# Patient Record
Sex: Male | Born: 1960
Health system: Southern US, Community
[De-identification: ages and names within clinical notes are randomized; demographics above are authoritative.]

## PROBLEM LIST (undated history)

## (undated) DIAGNOSIS — T7840XA Allergy, unspecified, initial encounter: Secondary | ICD-10-CM

## (undated) DIAGNOSIS — R011 Cardiac murmur, unspecified: Secondary | ICD-10-CM

## (undated) DIAGNOSIS — I10 Essential (primary) hypertension: Secondary | ICD-10-CM

## (undated) DIAGNOSIS — C3492 Malignant neoplasm of unspecified part of left bronchus or lung: Secondary | ICD-10-CM

## (undated) HISTORY — PX: KNEE SURGERY: SHX244

## (undated) HISTORY — DX: Essential (primary) hypertension: I10

## (undated) HISTORY — DX: Cardiac murmur, unspecified: R01.1

## (undated) HISTORY — DX: Allergy, unspecified, initial encounter: T78.40XA

## (undated) HISTORY — DX: Malignant neoplasm of unspecified part of left bronchus or lung: C34.92

## (undated) MED FILL — Iron Sucrose Inj 20 MG/ML (Fe Equiv): INTRAVENOUS | Qty: 10 | Status: AC

---

## 2003-06-22 ENCOUNTER — Ambulatory Visit (HOSPITAL_COMMUNITY): Admission: RE | Admit: 2003-06-22 | Discharge: 2003-06-22 | Payer: Self-pay | Admitting: Orthopedic Surgery

## 2003-06-22 ENCOUNTER — Encounter: Payer: Self-pay | Admitting: Orthopedic Surgery

## 2003-09-10 ENCOUNTER — Observation Stay (HOSPITAL_COMMUNITY): Admission: RE | Admit: 2003-09-10 | Discharge: 2003-09-11 | Payer: Self-pay | Admitting: Orthopedic Surgery

## 2006-12-06 ENCOUNTER — Emergency Department: Payer: Self-pay

## 2007-07-16 ENCOUNTER — Emergency Department: Payer: Self-pay | Admitting: Unknown Physician Specialty

## 2007-07-17 ENCOUNTER — Emergency Department: Payer: Self-pay | Admitting: Emergency Medicine

## 2010-02-01 ENCOUNTER — Emergency Department: Payer: Self-pay | Admitting: Emergency Medicine

## 2011-07-08 ENCOUNTER — Emergency Department: Payer: Self-pay | Admitting: Emergency Medicine

## 2013-09-01 ENCOUNTER — Ambulatory Visit: Payer: Self-pay | Admitting: Unknown Physician Specialty

## 2013-09-04 LAB — PATHOLOGY REPORT

## 2013-11-27 ENCOUNTER — Ambulatory Visit: Payer: Self-pay | Admitting: Hematology and Oncology

## 2013-11-28 LAB — CBC CANCER CENTER
BASOS ABS: 0 x10 3/mm (ref 0.0–0.1)
Basophil %: 0.5 %
EOS ABS: 0.1 x10 3/mm (ref 0.0–0.7)
EOS PCT: 1.4 %
HCT: 36.5 % — ABNORMAL LOW (ref 40.0–52.0)
HGB: 12.5 g/dL — AB (ref 13.0–18.0)
Lymphocyte #: 1.1 x10 3/mm (ref 1.0–3.6)
Lymphocyte %: 27.6 %
MCH: 30.7 pg (ref 26.0–34.0)
MCHC: 34.2 g/dL (ref 32.0–36.0)
MCV: 90 fL (ref 80–100)
Monocyte #: 0.3 x10 3/mm (ref 0.2–1.0)
Monocyte %: 6.9 %
NEUTROS PCT: 63.6 %
Neutrophil #: 2.4 x10 3/mm (ref 1.4–6.5)
Platelet: 354 x10 3/mm (ref 150–440)
RBC: 4.07 10*6/uL — AB (ref 4.40–5.90)
RDW: 12.7 % (ref 11.5–14.5)
WBC: 3.8 x10 3/mm (ref 3.8–10.6)

## 2013-12-21 ENCOUNTER — Ambulatory Visit: Payer: Self-pay | Admitting: Hematology and Oncology

## 2015-03-01 ENCOUNTER — Other Ambulatory Visit: Payer: Self-pay | Admitting: Hematology and Oncology

## 2015-03-01 ENCOUNTER — Encounter: Payer: Self-pay | Admitting: Hematology and Oncology

## 2015-03-01 DIAGNOSIS — C3492 Malignant neoplasm of unspecified part of left bronchus or lung: Secondary | ICD-10-CM

## 2015-05-10 ENCOUNTER — Telehealth: Payer: Self-pay | Admitting: *Deleted

## 2015-05-10 NOTE — Telephone Encounter (Signed)
Opened in error

## 2016-09-07 ENCOUNTER — Emergency Department
Admission: EM | Admit: 2016-09-07 | Discharge: 2016-09-07 | Disposition: A | Discharge status: Home / Self Care | Payer: 59 | Attending: Emergency Medicine | Admitting: Emergency Medicine

## 2016-09-07 ENCOUNTER — Emergency Department: Payer: 59

## 2016-09-07 ENCOUNTER — Encounter: Payer: Self-pay | Admitting: Emergency Medicine

## 2016-09-07 DIAGNOSIS — N201 Calculus of ureter: Secondary | ICD-10-CM | POA: Insufficient documentation

## 2016-09-07 DIAGNOSIS — N200 Calculus of kidney: Secondary | ICD-10-CM

## 2016-09-07 DIAGNOSIS — Z85118 Personal history of other malignant neoplasm of bronchus and lung: Secondary | ICD-10-CM | POA: Diagnosis not present

## 2016-09-07 DIAGNOSIS — R109 Unspecified abdominal pain: Secondary | ICD-10-CM | POA: Diagnosis present

## 2016-09-07 LAB — URINALYSIS COMPLETE WITH MICROSCOPIC (ARMC ONLY)
BILIRUBIN URINE: NEGATIVE
Bacteria, UA: NONE SEEN
Glucose, UA: NEGATIVE mg/dL
KETONES UR: NEGATIVE mg/dL
Leukocytes, UA: NEGATIVE
NITRITE: NEGATIVE
PROTEIN: 30 mg/dL — AB
SPECIFIC GRAVITY, URINE: 1.019 (ref 1.005–1.030)
Squamous Epithelial / LPF: NONE SEEN
pH: 8 (ref 5.0–8.0)

## 2016-09-07 LAB — CBC WITH DIFFERENTIAL/PLATELET
Basophils Absolute: 0 10*3/uL (ref 0–0.1)
Basophils Relative: 0 %
EOS ABS: 0 10*3/uL (ref 0–0.7)
EOS PCT: 0 %
HCT: 36.9 % — ABNORMAL LOW (ref 40.0–52.0)
Hemoglobin: 11.9 g/dL — ABNORMAL LOW (ref 13.0–18.0)
LYMPHS ABS: 0.7 10*3/uL — AB (ref 1.0–3.6)
Lymphocytes Relative: 8 %
MCH: 28.4 pg (ref 26.0–34.0)
MCHC: 32.3 g/dL (ref 32.0–36.0)
MCV: 87.9 fL (ref 80.0–100.0)
MONOS PCT: 3 %
Monocytes Absolute: 0.3 10*3/uL (ref 0.2–1.0)
Neutro Abs: 8.5 10*3/uL — ABNORMAL HIGH (ref 1.4–6.5)
Neutrophils Relative %: 89 %
PLATELETS: 419 10*3/uL (ref 150–440)
RBC: 4.19 MIL/uL — ABNORMAL LOW (ref 4.40–5.90)
RDW: 13.5 % (ref 11.5–14.5)
WBC: 9.5 10*3/uL (ref 3.8–10.6)

## 2016-09-07 LAB — BASIC METABOLIC PANEL
Anion gap: 9 (ref 5–15)
BUN: 13 mg/dL (ref 6–20)
CO2: 25 mmol/L (ref 22–32)
CREATININE: 1.32 mg/dL — AB (ref 0.61–1.24)
Calcium: 9.5 mg/dL (ref 8.9–10.3)
Chloride: 106 mmol/L (ref 101–111)
GFR calc Af Amer: >60 mL/min (ref >60)
GFR, EST NON AFRICAN AMERICAN: 60 mL/min — AB (ref >60)
Glucose, Bld: 117 mg/dL — ABNORMAL HIGH (ref 65–99)
Potassium: 3.6 mmol/L (ref 3.5–5.1)
SODIUM: 140 mmol/L (ref 135–145)

## 2016-09-07 MED ORDER — HYDROMORPHONE HCL 1 MG/ML IJ SOLN
INTRAMUSCULAR | Status: AC
  Filled 2016-09-07: qty 1

## 2016-09-07 MED ORDER — TAMSULOSIN HCL 0.4 MG PO CAPS
0.4000 mg | ORAL_CAPSULE | Freq: Once | ORAL | Status: AC
  Administered 2016-09-07: 0.4 mg via ORAL
  Filled 2016-09-07 (×2): qty 1

## 2016-09-07 MED ORDER — ACETAMINOPHEN 500 MG PO TABS
1000.0000 mg | ORAL_TABLET | Freq: Once | ORAL | Status: AC
  Administered 2016-09-07: 1000 mg via ORAL
  Filled 2016-09-07: qty 2

## 2016-09-07 MED ORDER — ONDANSETRON 4 MG PO TBDP: 4.0000 mg | ORAL_TABLET | Freq: Three times a day (TID) | ORAL | 0 refills | Status: DC | PRN

## 2016-09-07 MED ORDER — OXYCODONE HCL 5 MG PO TABS
5.0000 mg | ORAL_TABLET | Freq: Once | ORAL | Status: AC
  Administered 2016-09-07: 5 mg via ORAL
  Filled 2016-09-07: qty 1

## 2016-09-07 MED ORDER — SODIUM CHLORIDE 0.9 % IV SOLN
Freq: Once | INTRAVENOUS | Status: AC
  Administered 2016-09-07: 15:00:00 via INTRAVENOUS

## 2016-09-07 MED ORDER — ONDANSETRON HCL 4 MG/2ML IJ SOLN
INTRAMUSCULAR | Status: AC
  Administered 2016-09-07: 4 mg
  Filled 2016-09-07: qty 2

## 2016-09-07 MED ORDER — KETOROLAC TROMETHAMINE 30 MG/ML IJ SOLN
30.0000 mg | Freq: Once | INTRAMUSCULAR | Status: AC
  Administered 2016-09-07: 30 mg via INTRAVENOUS
  Filled 2016-09-07: qty 1

## 2016-09-07 MED ORDER — OXYCODONE-ACETAMINOPHEN 5-325 MG PO TABS: 1.0000 | ORAL_TABLET | ORAL | 0 refills | Status: DC | PRN

## 2016-09-07 MED ORDER — HYDROMORPHONE HCL 1 MG/ML IJ SOLN
1.0000 mg | Freq: Once | INTRAMUSCULAR | Status: AC
  Administered 2016-09-07: 1 mg via INTRAVENOUS

## 2016-09-07 MED ORDER — MORPHINE SULFATE (PF) 4 MG/ML IV SOLN
INTRAVENOUS | Status: AC
  Administered 2016-09-07: 4 mg
  Filled 2016-09-07: qty 1

## 2016-09-07 MED ORDER — IBUPROFEN 800 MG PO TABS: 800.0000 mg | ORAL_TABLET | Freq: Three times a day (TID) | ORAL | 0 refills | Status: DC | PRN

## 2016-09-07 MED ORDER — TAMSULOSIN HCL 0.4 MG PO CAPS: 0.4000 mg | ORAL_CAPSULE | Freq: Every day | ORAL | 0 refills | Status: AC

## 2016-09-07 NOTE — Discharge Instructions (Signed)
You have been seen in the Emergency Department (ED)  Today and was diagnosed with kidney stones. While the stone is traveling through the ureter, which is the tube that carries urine from the kidney to the bladder, you will probably feel pain. The pain may be mild or very severe. You may also have some blood in your urine. As soon as the stone reaches the bladder, any intense pain should go away. If a stone is too large to pass on its own, you may need a medical procedure to help you pass the stone.   As we have discussed, please drink plenty of fluids and use a urinary strainer to attempt to capture the stone.  Please make a follow up appointment with Urology in the next week by calling the number below and bring the stone with you.  Take ibuprofen '800mg'$  every 6 hours for the pain. If the pain is not well controlled with ibuprofen you may take one percocet every 4 hours. Do not take tylenol while taking percocet. Please also take your prescribed flomax daily. Check with your doctor if you have a history of gastritis, stomach ulcers, renal failure or impaired kidney function as you may not be able to take ibuprofen/ motrin. Your doctor can give you a different prescription for pain control.  Follow-up with your doctor or return to the ER in 12-24 hours if your pain is not well controlled, if you develop pain or burning with urination, or if you develop a fever. Otherwise follow up in 3-5 days with your doctor.  When should you call for help?  Call your doctor now or seek immediate medical care if:  You cannot keep down fluids.  Your pain gets worse.  You have a fever or chills.  You have new or worse pain in your back just below your rib cage (the flank area).  You have new or more blood in your urine. You have pain or burning with urination You are unable to urinate You have abdominal pain  Watch closely for changes in your health, and be sure to contact your doctor if:  You do not get better as  expected  How can you care for yourself at home?  Drink plenty of fluids, enough so that your urine is light yellow or clear like water. If you have kidney, heart, or liver disease and have to limit fluids, talk with your doctor before you increase the amount of fluids you drink.  Take pain medicines exactly as directed. Call your doctor if you think you are having a problem with your medicine.  If the doctor gave you a prescription medicine for pain, take it as prescribed.  If you are not taking a prescription pain medicine, ask your doctor if you can take an over-the-counter medicine. Read and follow all instructions on the label. Your doctor may ask you to strain your urine so that you can collect your kidney stone when it passes. You can use a kitchen strainer or a tea strainer to catch the stone. Store it in a plastic bag until you see your doctor again.  Preventing future kidney stones  Some changes in your diet may help prevent kidney stones. Depending on the cause of your stones, your doctor may recommend that you:  Drink plenty of fluids, enough so that your urine is light yellow or clear like water. If you have kidney, heart, or liver disease and have to limit fluids, talk with your doctor before you increase  the amount of fluids you drink.  Limit coffee, tea, and alcohol. Also avoid grapefruit juice.  Do not take more than the recommended daily dose of vitamins C and D.  Avoid antacids such as Gaviscon, Maalox, Mylanta, or Tums.  Limit the amount of salt (sodium) in your diet.  Eat a balanced diet that is not too high in protein.  Limit foods that are high in a substance called oxalate, which can cause kidney stones. These foods include dark green vegetables, rhubarb, chocolate, wheat bran, nuts, cranberries, and beans.

## 2016-09-07 NOTE — ED Triage Notes (Signed)
Pt with lower left flank pain started about an hour ago.

## 2016-09-07 NOTE — ED Provider Notes (Signed)
[facility] Emergency Department Provider Note  ____________________________________________  Time seen: Approximately 3:00 PM  I have reviewed the triage vital signs and the nursing notes.   HISTORY  Chief Complaint Flank Pain   HPI Randy Sawyer is a 55 y.o. male no significant past medical history who presents for evaluation of left flank pain. Patient reports sudden onset of left-sided flank pain radiating to the left lower quadrant that started an hour and a half ago. The pain is severe, sharp, constant, associated with a dry heaving and nausea. Patient denies ever having similar pain, or history of kidney stones. Patient denies having history of lung cancer as it is documented in his past medical history. He denies chest pain or shortness of breath, constipation, diarrhea, fever, chills, dysuria, hematuria.  History reviewed. No pertinent past medical history.  Patient Active Problem List   Diagnosis Date Noted  . Cancer of left lung [facility])     History reviewed. No pertinent surgical history.  Prior to Admission medications   Medication Sig Start Date End Date Taking? Authorizing Provider  ibuprofen (ADVIL,MOTRIN) 800 MG tablet Take 1 tablet (800 mg total) by mouth every 8 (eight) hours as needed. 09/07/16   Rudene Re, MD  ondansetron (ZOFRAN ODT) 4 MG disintegrating tablet Take 1 tablet (4 mg total) by mouth every 8 (eight) hours as needed for nausea or vomiting. 09/07/16   Rudene Re, MD  oxyCODONE-acetaminophen (ROXICET) 5-325 MG tablet Take 1 tablet by mouth every 4 (four) hours as needed for severe pain. 09/07/16   Rudene Re, MD  tamsulosin (FLOMAX) 0.4 MG CAPS capsule Take 1 capsule (0.4 mg total) by mouth daily. 09/07/16 09/14/16  Rudene Re, MD    Allergies Patient has no known allergies.  No family history on file.  Social History Social History  Substance Use Topics  . Smoking status: Never Smoker  .  Smokeless tobacco: Never Used  . Alcohol use Not on file    Review of Systems  Constitutional: Negative for fever. Eyes: Negative for visual changes. ENT: Negative for sore throat. Cardiovascular: Negative for chest pain. Respiratory: Negative for shortness of breath. Gastrointestinal: Negative for abdominal pain, vomiting or diarrhea. Genitourinary: Negative for dysuria. + L flank pain Musculoskeletal: Negative for back pain. Skin: Negative for rash. Neurological: Negative for headaches, weakness or numbness.  ____________________________________________   PHYSICAL EXAM:  VITAL SIGNS: ED Triage Vitals  Enc Vitals Group     BP 09/07/16 1413 123/73     Pulse Rate 09/07/16 1413 69     Resp 09/07/16 1413 20     Temp 09/07/16 1413 97.5 F (36.4 C)     Temp Source 09/07/16 1413 Oral     SpO2 09/07/16 1413 100 %     Weight 09/07/16 1415 213 lb (96.6 kg)     Height 09/07/16 1415 '5\' 9"'$  (1.753 m)     Head Circumference --      Peak Flow --      Pain Score 09/07/16 1415 10     Pain Loc --      Pain Edu? --      Excl. in [place]? --     Constitutional: Alert and oriented. Well appearing and in no apparent distress. HEENT:      Head: Normocephalic and atraumatic.         Eyes: Conjunctivae are normal. Sclera is non-icteric. EOMI. PERRL      Mouth/Throat: Mucous membranes are moist.  Neck: Supple with no signs of meningismus. Cardiovascular: Regular rate and rhythm. No murmurs, gallops, or rubs. 2+ symmetrical distal pulses are present in all extremities. No JVD. Respiratory: Normal respiratory effort. Lungs are clear to auscultation bilaterally. No wheezes, crackles, or rhonchi.  Gastrointestinal: Soft, non tender, and non distended with positive bowel sounds. No rebound or guarding. Genitourinary: No CVA tenderness. Bilateral descended testes with positive cremasteric reflex and no tenderness to palpation, no swelling or erythema of the scrotum, no evidence of inguinal hernia  bilaterally. Musculoskeletal: Nontender with normal range of motion in all extremities. No edema, cyanosis, or erythema of extremities. Neurologic: Normal speech and language. Face is symmetric. Moving all extremities. No gross focal neurologic deficits are appreciated. Skin: Skin is warm, dry and intact. No rash noted. Psychiatric: Mood and affect are normal. Speech and behavior are normal.  ____________________________________________   LABS (all labs ordered are listed, but only abnormal results are displayed)  Labs Reviewed  URINALYSIS COMPLETEWITH MICROSCOPIC ([facility] ONLY) - Abnormal; Notable for the following:       Result Value   Color, Urine YELLOW (*)    APPearance CLOUDY (*)    Hgb urine dipstick 1+ (*)    Protein, ur 30 (*)    All other components within normal limits  CBC WITH DIFFERENTIAL/PLATELET - Abnormal; Notable for the following:    RBC 4.19 (*)    Hemoglobin 11.9 (*)    HCT 36.9 (*)    Neutro Abs 8.5 (*)    Lymphs Abs 0.7 (*)    All other components within normal limits  BASIC METABOLIC PANEL - Abnormal; Notable for the following:    Glucose, Bld 117 (*)    Creatinine, Ser 1.32 (*)    GFR calc non Af Amer 60 (*)    All other components within normal limits  URINE CULTURE   ____________________________________________  EKG  none ____________________________________________  RADIOLOGY  CT renal: Obstructing 3 x 4 mm stone in the left mid ureter with mild hydroureteronephrosis. ____________________________________________   PROCEDURES  Procedure(s) performed: None Procedures Critical Care performed:  None ____________________________________________   INITIAL IMPRESSION / ASSESSMENT AND PLAN / ED COURSE  55 y.o. male no significant past medical history who presents for evaluation of sudden onset of left flank pain radiating to the groin. Abdomen is soft and nontender, no flank tenderness, GU exams within normal limits. Vital signs are within  normal limits. Urinalysis 1+ hemoglobin in 6-30 rbc's concerning for possible kidney stone. Patient was given IV morphine, IV fluids, IV Zofran. We'll check basic labs and get a CT renal.  Clinical Course as of Sep 07 1846  Thu Sep 07, 2016  1845 Patient's pain is well controlled. Tolerating by mouth. CT scan showing a 4 mm stone. UA with no evidence of urinary tract infection. Normal white count, stable hemoglobin and creatinine. Patient be discharged home on Flomax, ibuprofen, Percocet, Zofran, and follow up with urology.  [CV]    Clinical Course User Index [CV] Rudene Re, MD    Pertinent labs & imaging results that were available during my care of the patient were reviewed by me and considered in my medical decision making (see chart for details).    ____________________________________________   FINAL CLINICAL IMPRESSION(S) / ED DIAGNOSES  Final diagnoses:  Kidney stone      NEW MEDICATIONS STARTED DURING THIS VISIT:  New Prescriptions   IBUPROFEN (ADVIL,MOTRIN) 800 MG TABLET    Take 1 tablet (800 mg total) by mouth every 8 (eight)  hours as needed.   ONDANSETRON (ZOFRAN ODT) 4 MG DISINTEGRATING TABLET    Take 1 tablet (4 mg total) by mouth every 8 (eight) hours as needed for nausea or vomiting.   OXYCODONE-ACETAMINOPHEN (ROXICET) 5-325 MG TABLET    Take 1 tablet by mouth every 4 (four) hours as needed for severe pain.   TAMSULOSIN (FLOMAX) 0.4 MG CAPS CAPSULE    Take 1 capsule (0.4 mg total) by mouth daily.     Note:  This document was prepared using Dragon voice recognition software and may include unintentional dictation errors.    Rudene Re, MD 09/07/16 [phone]

## 2016-09-09 LAB — URINE CULTURE: CULTURE: NO GROWTH

## 2018-02-05 DIAGNOSIS — J301 Allergic rhinitis due to pollen: Secondary | ICD-10-CM | POA: Diagnosis not present

## 2018-02-05 DIAGNOSIS — Z0000 Encounter for general adult medical examination without abnormal findings: Secondary | ICD-10-CM | POA: Diagnosis not present

## 2018-02-07 DIAGNOSIS — D649 Anemia, unspecified: Secondary | ICD-10-CM | POA: Diagnosis not present

## 2018-02-07 DIAGNOSIS — Z1159 Encounter for screening for other viral diseases: Secondary | ICD-10-CM | POA: Diagnosis not present

## 2018-02-07 DIAGNOSIS — Z0000 Encounter for general adult medical examination without abnormal findings: Secondary | ICD-10-CM | POA: Diagnosis not present

## 2018-02-07 DIAGNOSIS — E669 Obesity, unspecified: Secondary | ICD-10-CM | POA: Diagnosis not present

## 2018-07-22 ENCOUNTER — Emergency Department (HOSPITAL_COMMUNITY): Payer: 59

## 2018-07-22 ENCOUNTER — Encounter (HOSPITAL_COMMUNITY): Payer: Self-pay | Admitting: Emergency Medicine

## 2018-07-22 ENCOUNTER — Other Ambulatory Visit: Payer: Self-pay

## 2018-07-22 ENCOUNTER — Emergency Department (HOSPITAL_COMMUNITY)
Admission: EM | Admit: 2018-07-22 | Discharge: 2018-07-22 | Disposition: A | Discharge status: Home / Self Care | Payer: 59 | Attending: Emergency Medicine | Admitting: Emergency Medicine

## 2018-07-22 DIAGNOSIS — Y998 Other external cause status: Secondary | ICD-10-CM | POA: Diagnosis not present

## 2018-07-22 DIAGNOSIS — Y929 Unspecified place or not applicable: Secondary | ICD-10-CM | POA: Insufficient documentation

## 2018-07-22 DIAGNOSIS — Y939 Activity, unspecified: Secondary | ICD-10-CM | POA: Insufficient documentation

## 2018-07-22 DIAGNOSIS — S92515A Nondisplaced fracture of proximal phalanx of left lesser toe(s), initial encounter for closed fracture: Secondary | ICD-10-CM | POA: Diagnosis not present

## 2018-07-22 DIAGNOSIS — S99102A Unspecified physeal fracture of left metatarsal, initial encounter for closed fracture: Secondary | ICD-10-CM

## 2018-07-22 DIAGNOSIS — S99922A Unspecified injury of left foot, initial encounter: Secondary | ICD-10-CM | POA: Diagnosis not present

## 2018-07-22 DIAGNOSIS — S92302A Fracture of unspecified metatarsal bone(s), left foot, initial encounter for closed fracture: Secondary | ICD-10-CM | POA: Insufficient documentation

## 2018-07-22 DIAGNOSIS — W228XXA Striking against or struck by other objects, initial encounter: Secondary | ICD-10-CM | POA: Diagnosis not present

## 2018-07-22 MED ORDER — IBUPROFEN 800 MG PO TABS: 800.0000 mg | ORAL_TABLET | Freq: Three times a day (TID) | ORAL | 0 refills | Status: DC | PRN

## 2018-07-22 NOTE — ED Provider Notes (Signed)
[facility] EMERGENCY DEPARTMENT Provider Note   CSN: [id number] Arrival date & time: 07/22/18  1321     History   Chief Complaint Chief Complaint  Patient presents with  . Toe Pain    HPI Randy Sawyer is a 57 y.o. male.  The history is provided by the patient. No language interpreter was used.  Toe Pain      57 year old male presenting for evaluation of toe injury.  Patient report last night he accidentally stubbed his left little toe against the leg of a piece of furniture.  Report acute onset of sharp pain to his toe with swelling.  States swelling did improved after I but he would like to check to make sure it is not broken.  Pain is mild at this time pain is nonradiating, no other injury.  Denies any numbness.  History reviewed. No pertinent past medical history.  Patient Active Problem List   Diagnosis Date Noted  . Cancer of left lung [facility])     History reviewed. No pertinent surgical history.      Home Medications    Prior to Admission medications   Medication Sig Start Date End Date Taking? Authorizing Provider  ibuprofen (ADVIL,MOTRIN) 800 MG tablet Take 1 tablet (800 mg total) by mouth every 8 (eight) hours as needed. 09/07/16   Rudene Re, MD  ondansetron (ZOFRAN ODT) 4 MG disintegrating tablet Take 1 tablet (4 mg total) by mouth every 8 (eight) hours as needed for nausea or vomiting. 09/07/16   Rudene Re, MD  oxyCODONE-acetaminophen (ROXICET) 5-325 MG tablet Take 1 tablet by mouth every 4 (four) hours as needed for severe pain. 09/07/16   Rudene Re, MD    Family History History reviewed. No pertinent family history.  Social History Social History   Tobacco Use  . Smoking status: Never Smoker  . Smokeless tobacco: Never Used  Substance Use Topics  . Alcohol use: Not on file  . Drug use: Not on file     Allergies   Patient has no known allergies.   Review of Systems Review of Systems    Constitutional: Negative for fever.  Musculoskeletal: Positive for joint swelling.  Neurological: Negative for numbness.     Physical Exam Updated Vital Signs BP (!) 155/79 (BP Location: Right Arm)   Pulse 65   Temp 98.5 F (36.9 C) (Oral)   Resp 16   SpO2 99%   Physical Exam  Constitutional: He appears well-developed and well-nourished. No distress.  HENT:  Head: Atraumatic.  Eyes: Conjunctivae are normal.  Neck: Neck supple.  Musculoskeletal: He exhibits tenderness (Left foot: Tenderness to fourth and fifth metatarsal region on palpation with mild swelling noted.  No obvious deformity, dorsalis pedis pulse palpable with brisk cap refill).  Neurological: He is alert.  Skin: No rash noted.  Psychiatric: He has a normal mood and affect.  Nursing note and vitals reviewed.    ED Treatments / Results  Labs (all labs ordered are listed, but only abnormal results are displayed) Labs Reviewed - No data to display  EKG None  Radiology Dg Foot Complete Left  Result Date: 07/22/2018 CLINICAL DATA:  Left little toe pain since an injury when the patient accidentally kicked a chair this morning. Initial encounter. EXAM: LEFT FOOT - COMPLETE 3+ VIEW COMPARISON:  None. FINDINGS: There is an acute nondisplaced fracture through the diaphysis of the proximal phalanx of little toe. No other acute bony or joint abnormality is identified. Sclerotic lesion the  mid diaphysis of the second metatarsal is most consistent with a bone island. No evidence of arthropathy. IMPRESSION: Nondisplaced diaphyseal fracture proximal phalanx left little toe. Electronically Signed   By: Inge Rise M.D.   On: 07/22/2018 15:08    Procedures Procedures (including critical care time)  Medications Ordered in ED Medications - No data to display   Initial Impression / Assessment and Plan / ED Course  I have reviewed the triage vital signs and the nursing notes.  Pertinent labs & imaging results that  were available during my care of the patient were reviewed by me and considered in my medical decision making (see chart for details).     BP (!) 155/79 (BP Location: Right Arm)   Pulse 65   Temp 98.5 F (36.9 C) (Oral)   Resp 16   SpO2 99%    Final Clinical Impressions(s) / ED Diagnoses   Final diagnoses:  Closed physeal fracture of metatarsal bone of left foot, unspecified metatarsal, unspecified physeal fracture configuration, initial encounter    ED Discharge Orders         Ordered    ibuprofen (ADVIL,MOTRIN) 800 MG tablet  Every 8 hours PRN     07/22/18 1526         3:24 PM Patient had a mechanical injury and injuring his left little toe.  X-ray obtained showed nondisplaced diaphyseal fracture proximal pain in his left lower toe.  Will provide postop shoe, and pain medication.  Orthopedic referral given as needed.   Domenic Moras, PA-C 07/22/18 [place], [place], DO 07/22/18 [phone]

## 2018-07-22 NOTE — ED Triage Notes (Signed)
Pt to ER for evaluation of left pinky toe pain after kicking his office chair this morning.

## 2019-01-30 DIAGNOSIS — Z0000 Encounter for general adult medical examination without abnormal findings: Secondary | ICD-10-CM | POA: Diagnosis not present

## 2019-01-30 DIAGNOSIS — J301 Allergic rhinitis due to pollen: Secondary | ICD-10-CM | POA: Diagnosis not present

## 2019-02-04 DIAGNOSIS — Z125 Encounter for screening for malignant neoplasm of prostate: Secondary | ICD-10-CM | POA: Diagnosis not present

## 2019-02-04 DIAGNOSIS — D649 Anemia, unspecified: Secondary | ICD-10-CM | POA: Diagnosis not present

## 2019-02-04 DIAGNOSIS — E669 Obesity, unspecified: Secondary | ICD-10-CM | POA: Diagnosis not present

## 2019-02-04 DIAGNOSIS — Z0000 Encounter for general adult medical examination without abnormal findings: Secondary | ICD-10-CM | POA: Diagnosis not present

## 2019-02-04 DIAGNOSIS — Z8042 Family history of malignant neoplasm of prostate: Secondary | ICD-10-CM | POA: Diagnosis not present

## 2019-08-18 DIAGNOSIS — J069 Acute upper respiratory infection, unspecified: Secondary | ICD-10-CM | POA: Insufficient documentation

## 2019-08-18 HISTORY — DX: Acute upper respiratory infection, unspecified: J06.9

## 2020-01-02 ENCOUNTER — Ambulatory Visit: Payer: 59 | Attending: Internal Medicine

## 2020-01-02 DIAGNOSIS — Z23 Encounter for immunization: Secondary | ICD-10-CM

## 2020-01-02 NOTE — Progress Notes (Signed)
   Covid-19 Vaccination Clinic  Name:  OBERT ESPINDOLA    MRN: [id number] DOB: 1961-01-02  01/02/2020  Mr. Prine was observed post Covid-19 immunization for 15 minutes without incident. He was provided with Vaccine Information Sheet and instruction to access the V-Safe system.   Mr. Grau was instructed to call 911 with any severe reactions post vaccine: Marland Kitchen Difficulty breathing  . Swelling of face and throat  . A fast heartbeat  . A bad rash all over body  . Dizziness and weakness   Immunizations Administered    Name Date Dose VIS Date Route   Pfizer COVID-19 Vaccine 01/02/2020 11:57 AM 0.3 mL 10/03/2019 Intramuscular   Manufacturer: [place]   Lot: [id number]   [place]: [id number]

## 2020-01-27 ENCOUNTER — Ambulatory Visit: Payer: 59 | Attending: Internal Medicine

## 2020-01-27 DIAGNOSIS — Z23 Encounter for immunization: Secondary | ICD-10-CM

## 2020-01-27 NOTE — Progress Notes (Signed)
   Covid-19 Vaccination Clinic  Name:  YATES WEISGERBER    MRN: [id number] DOB: 11/10/1960  01/27/2020  Mr. Lotts was observed post Covid-19 immunization for 15 minutes without incident. He was provided with Vaccine Information Sheet and instruction to access the V-Safe system.   Mr. Burnside was instructed to call 911 with any severe reactions post vaccine: Marland Kitchen Difficulty breathing  . Swelling of face and throat  . A fast heartbeat  . A bad rash all over body  . Dizziness and weakness   Immunizations Administered    Name Date Dose VIS Date Route   Pfizer COVID-19 Vaccine 01/27/2020 11:12 AM 0.3 mL 10/03/2019 Intramuscular   Manufacturer: [employer], [employer]   Lot: [id number]   [place]: [id number]

## 2021-03-18 ENCOUNTER — Other Ambulatory Visit: Payer: Self-pay | Admitting: Urology

## 2021-03-18 DIAGNOSIS — R972 Elevated prostate specific antigen [PSA]: Secondary | ICD-10-CM

## 2021-04-04 ENCOUNTER — Ambulatory Visit
Admission: RE | Admit: 2021-04-04 | Discharge: 2021-04-04 | Disposition: A | Discharge status: Home / Self Care | Payer: 59 | Source: Ambulatory Visit | Attending: Urology | Admitting: Urology

## 2021-04-04 ENCOUNTER — Other Ambulatory Visit: Payer: Self-pay

## 2021-04-04 DIAGNOSIS — R972 Elevated prostate specific antigen [PSA]: Secondary | ICD-10-CM | POA: Diagnosis not present

## 2021-04-04 MED ORDER — GADOBUTROL 1 MMOL/ML IV SOLN
10.0000 mL | Freq: Once | INTRAVENOUS | Status: AC | PRN
  Administered 2021-04-04: 10 mL via INTRAVENOUS

## 2022-02-21 DIAGNOSIS — E785 Hyperlipidemia, unspecified: Secondary | ICD-10-CM | POA: Insufficient documentation

## 2022-03-27 ENCOUNTER — Inpatient Hospital Stay: Payer: 59 | Attending: Oncology | Admitting: Oncology

## 2022-03-27 ENCOUNTER — Encounter: Payer: Self-pay | Admitting: Oncology

## 2022-03-27 ENCOUNTER — Inpatient Hospital Stay: Payer: 59

## 2022-03-27 VITALS — BP 143/86 | HR 62 | Temp 96.4°F | Ht 70.0 in | Wt 215.0 lb

## 2022-03-27 DIAGNOSIS — D75839 Thrombocytosis, unspecified: Secondary | ICD-10-CM

## 2022-03-27 DIAGNOSIS — K2950 Unspecified chronic gastritis without bleeding: Secondary | ICD-10-CM | POA: Insufficient documentation

## 2022-03-27 DIAGNOSIS — E291 Testicular hypofunction: Secondary | ICD-10-CM | POA: Insufficient documentation

## 2022-03-27 DIAGNOSIS — D649 Anemia, unspecified: Secondary | ICD-10-CM | POA: Insufficient documentation

## 2022-03-27 DIAGNOSIS — D500 Iron deficiency anemia secondary to blood loss (chronic): Secondary | ICD-10-CM

## 2022-03-27 DIAGNOSIS — Z8042 Family history of malignant neoplasm of prostate: Secondary | ICD-10-CM | POA: Insufficient documentation

## 2022-03-27 DIAGNOSIS — Z85118 Personal history of other malignant neoplasm of bronchus and lung: Secondary | ICD-10-CM | POA: Insufficient documentation

## 2022-03-27 DIAGNOSIS — F439 Reaction to severe stress, unspecified: Secondary | ICD-10-CM | POA: Insufficient documentation

## 2022-03-27 DIAGNOSIS — I10 Essential (primary) hypertension: Secondary | ICD-10-CM | POA: Insufficient documentation

## 2022-03-27 DIAGNOSIS — J309 Allergic rhinitis, unspecified: Secondary | ICD-10-CM | POA: Insufficient documentation

## 2022-03-27 DIAGNOSIS — Z801 Family history of malignant neoplasm of trachea, bronchus and lung: Secondary | ICD-10-CM | POA: Insufficient documentation

## 2022-03-27 DIAGNOSIS — Z79899 Other long term (current) drug therapy: Secondary | ICD-10-CM | POA: Insufficient documentation

## 2022-03-27 DIAGNOSIS — E669 Obesity, unspecified: Secondary | ICD-10-CM | POA: Insufficient documentation

## 2022-03-27 DIAGNOSIS — K219 Gastro-esophageal reflux disease without esophagitis: Secondary | ICD-10-CM | POA: Insufficient documentation

## 2022-03-27 DIAGNOSIS — K921 Melena: Secondary | ICD-10-CM | POA: Diagnosis not present

## 2022-03-27 HISTORY — DX: Reaction to severe stress, unspecified: F43.9

## 2022-03-27 LAB — CBC WITH DIFFERENTIAL/PLATELET
Abs Immature Granulocytes: 0.05 10*3/uL (ref 0.00–0.07)
Basophils Absolute: 0 10*3/uL (ref 0.0–0.1)
Basophils Relative: 0 %
Eosinophils Absolute: 0.1 10*3/uL (ref 0.0–0.5)
Eosinophils Relative: 2 %
HCT: 31.8 % — ABNORMAL LOW (ref 39.0–52.0)
Hemoglobin: 9.6 g/dL — ABNORMAL LOW (ref 13.0–17.0)
Immature Granulocytes: 1 %
Lymphocytes Relative: 29 %
Lymphs Abs: 1.3 10*3/uL (ref 0.7–4.0)
MCH: 26 pg (ref 26.0–34.0)
MCHC: 30.2 g/dL (ref 30.0–36.0)
MCV: 86.2 fL (ref 80.0–100.0)
Monocytes Absolute: 0.3 10*3/uL (ref 0.1–1.0)
Monocytes Relative: 7 %
Neutro Abs: 2.7 10*3/uL (ref 1.7–7.7)
Neutrophils Relative %: 61 %
Platelets: 452 10*3/uL — ABNORMAL HIGH (ref 150–400)
RBC: 3.69 MIL/uL — ABNORMAL LOW (ref 4.22–5.81)
RDW: 15.6 % — ABNORMAL HIGH (ref 11.5–15.5)
WBC: 4.5 10*3/uL (ref 4.0–10.5)
nRBC: 0 % (ref 0.0–0.2)

## 2022-03-27 LAB — IRON AND TIBC
Iron: 19 ug/dL — ABNORMAL LOW (ref 45–182)
Saturation Ratios: 4 % — ABNORMAL LOW (ref 17.9–39.5)
TIBC: 476 ug/dL — ABNORMAL HIGH (ref 250–450)
UIBC: 457 ug/dL

## 2022-03-27 LAB — LACTATE DEHYDROGENASE: LDH: 196 U/L — ABNORMAL HIGH (ref 98–192)

## 2022-03-27 LAB — VITAMIN B12: Vitamin B-12: 468 pg/mL (ref 180–914)

## 2022-03-27 LAB — FOLATE: Folate: 19.5 ng/mL (ref >5.9)

## 2022-03-27 LAB — RETIC PANEL
Immature Retic Fract: 17.3 % — ABNORMAL HIGH (ref 2.3–15.9)
RBC.: 3.67 MIL/uL — ABNORMAL LOW (ref 4.22–5.81)
Retic Count, Absolute: 39.6 10*3/uL (ref 19.0–186.0)
Retic Ct Pct: 1.1 % (ref 0.4–3.1)
Reticulocyte Hemoglobin: 29.5 pg (ref >27.9)

## 2022-03-27 LAB — FERRITIN: Ferritin: 7 ng/mL — ABNORMAL LOW (ref 24–336)

## 2022-03-27 MED ORDER — FERROUS SULFATE 325 (65 FE) MG PO TBEC: 325.0000 mg | DELAYED_RELEASE_TABLET | Freq: Three times a day (TID) | ORAL | 2 refills | Status: DC

## 2022-03-27 MED ORDER — DOCUSATE SODIUM 100 MG PO CAPS: 100.0000 mg | ORAL_CAPSULE | Freq: Every day | ORAL | 2 refills | Status: DC | PRN

## 2022-03-27 NOTE — Progress Notes (Signed)
Hematology/Oncology Consult note [phone] [phone]         Patient Care Team: Sallee Lange, NP as PCP - General (Internal Medicine)  REFERRING PROVIDER: Sallee Lange, *  CHIEF COMPLAINTS/REASON FOR VISIT:  Evaluation of anemia  HISTORY OF PRESENTING ILLNESS:   Randy Sawyer is a  61 y.o.  male with PMH listed below was seen in consultation at the request of  Gauger, Victoriano Lain, *  for evaluation of anemia  Patient reports that he was previously evaluated by hematologist years ago and was told everything was fine.  Patient has a chronically decreased hemoglobin, dated back to at least 2015.  His baseline is around 11 .  02/20/2022, hemoglobin has decreased to 9.9, MCV 89.2.  Patient was referred to establish care with hematology for further evaluation. Patient reports chronic history of intermittent blood in the stool.  He reports a colonoscopy in 6 to 7 years, not available in current EMR. Denies any unintentional weight loss, fever, night sweats. He is also over-the-counter supplements including alpha king supreme, -testpsteron booster, yohimbe 451 ultra saw palmetto .  Since his last blood work, he has stopped testosterone. Patient drinks occasionally.  Review of Systems  Constitutional:  Negative for appetite change, chills, fatigue, fever and unexpected weight change.  HENT:   Negative for hearing loss and voice change.   Eyes:  Negative for eye problems and icterus.  Respiratory:  Negative for chest tightness, cough and shortness of breath.   Cardiovascular:  Negative for chest pain and leg swelling.  Gastrointestinal:  Negative for abdominal distention and abdominal pain.  Endocrine: Negative for hot flashes.  Genitourinary:  Negative for difficulty urinating, dysuria and frequency.   Musculoskeletal:  Negative for arthralgias.  Skin:  Negative for itching and rash.  Neurological:  Negative for light-headedness and  numbness.  Hematological:  Negative for adenopathy. Does not bruise/bleed easily.  Psychiatric/Behavioral:  Negative for confusion.    MEDICAL HISTORY:  Past Medical History:  Diagnosis Date   Allergy    Hypertension     SURGICAL HISTORY: Past Surgical History:  Procedure Laterality Date   KNEE SURGERY      SOCIAL HISTORY: Social History   Socioeconomic History   Marital status: Single    Spouse name: Not on file   Number of children: Not on file   Years of education: Not on file   Highest education level: Not on file  Occupational History   Not on file  Tobacco Use   Smoking status: Never   Smokeless tobacco: Never  Substance and Sexual Activity   Alcohol use: Yes    Alcohol/week: 1.0 standard drink    Types: 1 Glasses of wine per week    Comment: occationally.   Drug use: Never   Sexual activity: Yes  Other Topics Concern   Not on file  Social History Narrative   Not on file   Social Determinants of Health   Financial Resource Strain: Not on file  Food Insecurity: Not on file  Transportation Needs: Not on file  Physical Activity: Not on file  Stress: Not on file  Social Connections: Not on file  Intimate Partner Violence: Not on file    FAMILY HISTORY: Family History  Problem Relation Age of Onset   Hypertension Mother    Kidney failure Mother    Diabetes Mother    Prostate cancer Father    Diabetes Paternal Grandmother     ALLERGIES:  has No Known Allergies.  MEDICATIONS:  Current Outpatient Medications  Medication Sig Dispense Refill   amitriptyline (ELAVIL) 10 MG tablet Take by mouth.     ascorbic acid (VITAMIN C) 1000 MG tablet Take by mouth.     Calcium Carbonate-Vitamin D (OYSTER SHELL CALCIUM/D) 500-5 MG-MCG TABS Take by mouth.     ibuprofen (ADVIL,MOTRIN) 800 MG tablet Take 1 tablet (800 mg total) by mouth every 8 (eight) hours as needed for moderate pain. 30 tablet 0   losartan (COZAAR) 25 MG tablet Take 25 mg by mouth daily.      Multiple Vitamin (MULTIVITAMIN) capsule Take 1 capsule by mouth daily.     NON FORMULARY Alpha king supreme     NON FORMULARY Ultra Saw Palmetto formula     ondansetron (ZOFRAN ODT) 4 MG disintegrating tablet Take 1 tablet (4 mg total) by mouth every 8 (eight) hours as needed for nausea or vomiting. 20 tablet 0   oxyCODONE-acetaminophen (ROXICET) 5-325 MG tablet Take 1 tablet by mouth every 4 (four) hours as needed for severe pain. 20 tablet 0   rosuvastatin (CRESTOR) 5 MG tablet Take 5 mg by mouth daily.     triamcinolone (NASACORT) 55 MCG/ACT AERO nasal inhaler Place into the nose.     triamcinolone cream (KENALOG) 0.1 % Apply topically daily.     Yohimbe Bark (YOHIMBE PO) Take 451 capsules by mouth.     No current facility-administered medications for this visit.     PHYSICAL EXAMINATION: Vitals:   03/27/22 0949  BP: (!) 143/86  Pulse: 62  Temp: (!) 96.4 F (35.8 C)   Filed Weights   03/27/22 0949  Weight: 215 lb (97.5 kg)    Physical Exam Constitutional:      General: He is not in acute distress.    Appearance: He is obese.  HENT:     Head: Normocephalic and atraumatic.  Eyes:     General: No scleral icterus. Cardiovascular:     Rate and Rhythm: Normal rate and regular rhythm.     Heart sounds: Normal heart sounds.  Pulmonary:     Effort: Pulmonary effort is normal. No respiratory distress.     Breath sounds: No wheezing.  Abdominal:     General: Bowel sounds are normal. There is no distension.     Palpations: Abdomen is soft.  Musculoskeletal:        General: No deformity. Normal range of motion.     Cervical back: Normal range of motion and neck supple.  Skin:    General: Skin is warm and dry.     Findings: No erythema or rash.  Neurological:     Mental Status: He is alert and oriented to person, place, and time. Mental status is at baseline.     Cranial Nerves: No cranial nerve deficit.     Coordination: Coordination normal.  Psychiatric:        Mood and  Affect: Mood normal.    LABORATORY DATA:  I have reviewed the data as listed Lab Results  Component Value Date   WBC 4.5 03/27/2022   HGB 9.6 (L) 03/27/2022   HCT 31.8 (L) 03/27/2022   MCV 86.2 03/27/2022   PLT 452 (H) 03/27/2022   No results for input(s): NA, K, CL, CO2, GLUCOSE, BUN, CREATININE, CALCIUM, GFRNONAA, GFRAA, PROT, ALBUMIN, AST, ALT, ALKPHOS, BILITOT, BILIDIR, IBILI in the last 8760 hours. Iron/TIBC/Ferritin/ %Sat    Component Value Date/Time   IRON 19 (L) 03/27/2022 1028   TIBC 476 (H) 03/27/2022 1028  FERRITIN 7 (L) 03/27/2022 1028   IRONPCTSAT 4 (L) 03/27/2022 1028      RADIOGRAPHIC STUDIES: I have personally reviewed the radiological images as listed and agreed with the findings in the report. No results found.    ASSESSMENT & PLAN:  1. Iron deficiency anemia due to chronic blood loss   2. Thrombocytosis    #Anemia, recent blood work showed acute on chronic anemia with hemoglobin dropping to 9.9. Check CBC, iron, TIBC ferritin, B12, multiple myeloma panel, light chain ratio reticulocyte panel, LDH.  Check testosterone level. -Today's labs showed hemoglobin 9.6, iron saturation 4, TIBC 476, ferritin 7, consistent with iron deficiency anemia.  I recommend patient to start on ferrous sulfate 325 mg twice daily with Colace 100 mg daily as needed constipation.  Prescription will be sent to pharmacy.  #Thrombocytosis, could be secondary to iron deficiency. #Intermittent blood in the stool, he reports having colonoscopy done within the past 6 to 7 years although I do not see any colonoscopy in current EMR.  Patient will need gastroenterology work-up.  Orders Placed This Encounter  Procedures   CBC with Differential/Platelet    Standing Status:   Future    Number of Occurrences:   1    Standing Expiration Date:   03/28/2023   Vitamin B12    Standing Status:   Future    Number of Occurrences:   1    Standing Expiration Date:   03/28/2023   Folate    Standing  Status:   Future    Number of Occurrences:   1    Standing Expiration Date:   03/28/2023   Ferritin    Standing Status:   Future    Number of Occurrences:   1    Standing Expiration Date:   09/26/2022   Iron and TIBC    Standing Status:   Future    Number of Occurrences:   1    Standing Expiration Date:   03/28/2023   Multiple Myeloma Panel (SPEP&IFE w/QIG)    Standing Status:   Future    Number of Occurrences:   1    Standing Expiration Date:   03/28/2023   Kappa/lambda light chains    Standing Status:   Future    Number of Occurrences:   1    Standing Expiration Date:   03/28/2023   Retic Panel    Standing Status:   Future    Number of Occurrences:   1    Standing Expiration Date:   03/28/2023   Lactate dehydrogenase    Standing Status:   Future    Number of Occurrences:   1    Standing Expiration Date:   03/28/2023   Testosterone    Standing Status:   Future    Number of Occurrences:   1    Standing Expiration Date:   03/28/2023    All questions were answered. The patient knows to call the clinic with any problems questions or concerns.  cc Gauger, Victoriano Lain, *    Return of visit: 3 to 4 weeks to go over results. Thank you for this kind referral and the opportunity to participate in the care of this patient. A copy of today's note is routed to referring provider   Earlie Server, MD, PhD [facility] Hematology Oncology 03/27/2022

## 2022-03-28 ENCOUNTER — Telehealth: Payer: Self-pay

## 2022-03-28 LAB — KAPPA/LAMBDA LIGHT CHAINS
Kappa free light chain: 25.9 mg/L — ABNORMAL HIGH (ref 3.3–19.4)
Kappa, lambda light chain ratio: 1.53 (ref 0.26–1.65)
Lambda free light chains: 16.9 mg/L (ref 5.7–26.3)

## 2022-03-28 LAB — TESTOSTERONE: Testosterone: 202 ng/dL — ABNORMAL LOW (ref 264–916)

## 2022-03-28 NOTE — Telephone Encounter (Signed)
-----   Message from Earlie Server, MD sent at 03/27/2022  7:11 PM EDT ----- Please let patient know that his blood work showed iron deficiency anemia.  I recommend patient to take oral iron supplementation ferrous sulfate 325 mg twice daily.  If he gets constipation, recommend Colace 100 mg daily.  Most prescription was sent to pharmacy.  Other labs are pending.  Keep current appointment.

## 2022-03-28 NOTE — Telephone Encounter (Signed)
Called and informed patient of lab results and Dr. Collie Siad recommendation. Patient verbalized understanding.

## 2022-03-31 LAB — MULTIPLE MYELOMA PANEL, SERUM
Albumin SerPl Elph-Mcnc: 4 g/dL (ref 2.9–4.4)
Albumin/Glob SerPl: 1.2 (ref 0.7–1.7)
Alpha 1: 0.2 g/dL (ref 0.0–0.4)
Alpha2 Glob SerPl Elph-Mcnc: 0.6 g/dL (ref 0.4–1.0)
B-Globulin SerPl Elph-Mcnc: 1.2 g/dL (ref 0.7–1.3)
Gamma Glob SerPl Elph-Mcnc: 1.4 g/dL (ref 0.4–1.8)
Globulin, Total: 3.5 g/dL (ref 2.2–3.9)
IgA: 369 mg/dL (ref 90–386)
IgG (Immunoglobin G), Serum: 1469 mg/dL (ref 603–1613)
IgM (Immunoglobulin M), Srm: 64 mg/dL (ref 20–172)
Total Protein ELP: 7.5 g/dL (ref 6.0–8.5)

## 2022-04-02 ENCOUNTER — Encounter: Payer: Self-pay | Admitting: Oncology

## 2022-04-02 DIAGNOSIS — K921 Melena: Secondary | ICD-10-CM | POA: Insufficient documentation

## 2022-04-02 DIAGNOSIS — D509 Iron deficiency anemia, unspecified: Secondary | ICD-10-CM | POA: Insufficient documentation

## 2022-04-02 DIAGNOSIS — K649 Unspecified hemorrhoids: Secondary | ICD-10-CM | POA: Insufficient documentation

## 2022-04-17 ENCOUNTER — Inpatient Hospital Stay (HOSPITAL_BASED_OUTPATIENT_CLINIC_OR_DEPARTMENT_OTHER): Payer: 59 | Admitting: Oncology

## 2022-04-17 VITALS — BP 147/85 | HR 79 | Temp 96.7°F | Resp 18 | Ht 70.0 in | Wt 216.7 lb

## 2022-04-17 DIAGNOSIS — D75839 Thrombocytosis, unspecified: Secondary | ICD-10-CM | POA: Insufficient documentation

## 2022-04-17 DIAGNOSIS — D500 Iron deficiency anemia secondary to blood loss (chronic): Secondary | ICD-10-CM | POA: Diagnosis not present

## 2022-04-17 DIAGNOSIS — E291 Testicular hypofunction: Secondary | ICD-10-CM | POA: Insufficient documentation

## 2022-04-17 DIAGNOSIS — D509 Iron deficiency anemia, unspecified: Secondary | ICD-10-CM

## 2022-04-17 DIAGNOSIS — K921 Melena: Secondary | ICD-10-CM | POA: Diagnosis not present

## 2022-04-17 MED ORDER — FERROUS SULFATE 325 (65 FE) MG PO TBEC: 325.0000 mg | DELAYED_RELEASE_TABLET | Freq: Three times a day (TID) | ORAL | 2 refills | Status: DC

## 2022-04-17 NOTE — Assessment & Plan Note (Addendum)
Labs reviewed and discussed with patient.  Consistent with iron deficiency anemia, Patient has started on ferrous sulfate 325 mg twice daily with Colace 100 mg daily as needed for constipation Overall he tolerates. Recommend to repeat lab work in 2 to 3 weeks.  We discussed about the option of IV Venofer treatments if no significant improvement despite on oral iron supplementation.  Rationale and potential side effects of IV Venofer treatments were reviewed in details with patient.  He agrees with the plan.  Etiology of iron deficiency anemia is unknown.  Previous gastroenterology note in 2017,He had a colonoscopy and upper endoscopy performed 09/01/13: Colonoscopy showed small internal hemorrhoids, normal prostate. Exam otherwise normal. The upper endoscopy showed a normal esophagus, non bleeding erosive gastropathy described as two dispersed small non bleeding erosions found in the gastric antrum. There is no stigmata of recent bleeding. There is normal duodenum. Pathology returned chronic gastritis with intestinal metaplasia and atrophy. Negative for Helicobacter pylori, negative for dysplasia.    Recommend patient to reestablish care with gastroenterology for further evaluation.  He agrees with the plan.

## 2022-04-17 NOTE — Progress Notes (Addendum)
Hematology/Oncology Progress note [phone] [phone]            Patient Care Team: Sallee Lange, NP as PCP - General (Internal Medicine)  ASSESSMENT & PLAN:   IDA (iron deficiency anemia) Labs reviewed and discussed with patient.  Consistent with iron deficiency anemia, Patient has started on ferrous sulfate 325 mg twice daily with Colace 100 mg daily as needed for constipation Overall he tolerates. Recommend to repeat lab work in 2 to 3 weeks.  We discussed about the option of IV Venofer treatments if no significant improvement despite on oral iron supplementation.  Rationale and potential side effects of IV Venofer treatments were reviewed in details with patient.  He agrees with the plan.  Etiology of iron deficiency anemia is unknown.  Previous gastroenterology note in 2017,He had a colonoscopy and upper endoscopy performed 09/01/13: Colonoscopy showed small internal hemorrhoids, normal prostate. Exam otherwise normal. The upper endoscopy showed a normal esophagus, non bleeding erosive gastropathy described as two dispersed small non bleeding erosions found in the gastric antrum. There is no stigmata of recent bleeding. There is normal duodenum. Pathology returned chronic gastritis with intestinal metaplasia and atrophy. Negative for Helicobacter pylori, negative for dysplasia.    Recommend patient to reestablish care with gastroenterology for further evaluation.  He agrees with the plan.   Thrombocytosis Likely secondary to iron deficiency anemia.  Blood in stool Needs GI work-up  Hypogonadism in male Testosterone level is low.  I recommend patient to further discuss with primary care provider for management.  Patient has history of lung cancer listed in the medical problem list however patient denies any previous history.  His father, had a history of lung cancer and passed away.  Patient is concerned that his father's medical records and  his medical records were confused in our electronic system.  Message sent to Environmental education officer.  Follow up  Refer to Dr.Toledo for IDA.  Lab encounter cbc iron tibc ferritin in 2-3 weeks  Follow up in 4 months- lab prior to MD + venofer. Iron labs retic panel.   Orders Placed This Encounter  Procedures   CBC with Differential/Platelet    Standing Status:   Future    Standing Expiration Date:   04/18/2023   Ferritin    Standing Status:   Future    Standing Expiration Date:   04/18/2023   Iron and TIBC    Standing Status:   Future    Standing Expiration Date:   04/18/2023   CBC with Differential/Platelet    Standing Status:   Future    Standing Expiration Date:   04/18/2023   Ferritin    Standing Status:   Future    Standing Expiration Date:   04/18/2023   Iron and TIBC    Standing Status:   Future    Standing Expiration Date:   04/18/2023   Retic Panel    Standing Status:   Future    Standing Expiration Date:   04/18/2023   Ambulatory referral to Gastroenterology    Referral Priority:   Routine    Referral Type:   Consultation    Referral Reason:   Specialty Services Required    Referred to Provider:   Efrain Sella, MD    Requested Specialty:   Gastroenterology    Number of Visits Requested:   1    All questions were answered. The patient knows to call the clinic with any problems, questions or concerns.  Earlie Server, MD,  PhD [facility] Hematology Oncology 04/17/2022   CHIEF COMPLAINTS/REASON FOR VISIT:  Follow-up for iron deficiency anemia  HISTORY OF PRESENTING ILLNESS:   Randy Sawyer is a  61 y.o.  male with PMH listed below was seen in consultation at the request of  Gauger, Victoriano Lain, *  for evaluation of anemia  Patient reports that he was previously evaluated by hematologist years ago and was told everything was fine. Patient has a chronically decreased hemoglobin, dated back to at least 2015.  His baseline is around 11 .  02/20/2022, hemoglobin has  decreased to 9.9, MCV 89.2.  Patient was referred to establish care with hematology for further evaluation. Patient reports chronic history of intermittent blood in the stool.  He reports a colonoscopy in 6 to 7 years, not available in current EMR. Denies any unintentional weight loss, fever, night sweats. He is also over-the-counter supplements including alpha king supreme, -testpsteron booster, yohimbe 451 ultra saw palmetto .  Since his last blood work, he has stopped testosterone. Patient drinks occasionally.  .INTERVAL HISTORY Randy Sawyer is a 61 y.o. male who has above history reviewed by me today presents for follow up visit for management of iron deficiency anemia Patient present to discuss results.  He has started on iron supplementation and tolerated well.  Fatigue has improved.  No new complaints.   Review of Systems  Constitutional:  Negative for appetite change, chills, fatigue, fever and unexpected weight change.  HENT:   Negative for hearing loss and voice change.   Eyes:  Negative for eye problems and icterus.  Respiratory:  Negative for chest tightness, cough and shortness of breath.   Cardiovascular:  Negative for chest pain and leg swelling.  Gastrointestinal:  Negative for abdominal distention and abdominal pain.  Endocrine: Negative for hot flashes.  Genitourinary:  Negative for difficulty urinating, dysuria and frequency.   Musculoskeletal:  Negative for arthralgias.  Skin:  Negative for itching and rash.  Neurological:  Negative for light-headedness and numbness.  Hematological:  Negative for adenopathy. Does not bruise/bleed easily.  Psychiatric/Behavioral:  Negative for confusion.     MEDICAL HISTORY:  Past Medical History:  Diagnosis Date   Allergy    Hypertension     SURGICAL HISTORY: Past Surgical History:  Procedure Laterality Date   KNEE SURGERY      SOCIAL HISTORY: Social History   Socioeconomic History   Marital status: Single     Spouse name: Not on file   Number of children: Not on file   Years of education: Not on file   Highest education level: Not on file  Occupational History   Not on file  Tobacco Use   Smoking status: Never   Smokeless tobacco: Never  Substance and Sexual Activity   Alcohol use: Yes    Alcohol/week: 1.0 standard drink of alcohol    Types: 1 Glasses of wine per week    Comment: occationally.   Drug use: Never   Sexual activity: Yes  Other Topics Concern   Not on file  Social History Narrative   Not on file   Social Determinants of Health   Financial Resource Strain: Not on file  Food Insecurity: Not on file  Transportation Needs: Not on file  Physical Activity: Not on file  Stress: Not on file  Social Connections: Not on file  Intimate Partner Violence: Not on file    FAMILY HISTORY: Family History  Problem Relation Age of Onset   Hypertension Mother  Kidney failure Mother    Diabetes Mother    Prostate cancer Father    Diabetes Paternal Grandmother     ALLERGIES:  has No Known Allergies.  MEDICATIONS:  Current Outpatient Medications  Medication Sig Dispense Refill   amitriptyline (ELAVIL) 10 MG tablet Take by mouth.     ascorbic acid (VITAMIN C) 1000 MG tablet Take by mouth.     Calcium Carbonate-Vitamin D (OYSTER SHELL CALCIUM/D) 500-5 MG-MCG TABS Take by mouth.     docusate sodium (COLACE) 100 MG capsule Take 1 capsule (100 mg total) by mouth daily as needed for mild constipation. 30 capsule 2   ibuprofen (ADVIL,MOTRIN) 800 MG tablet Take 1 tablet (800 mg total) by mouth every 8 (eight) hours as needed for moderate pain. 30 tablet 0   losartan (COZAAR) 25 MG tablet Take 25 mg by mouth daily.     Multiple Vitamin (MULTIVITAMIN) capsule Take 1 capsule by mouth daily.     NON FORMULARY Alpha king supreme     NON FORMULARY Ultra Saw Palmetto formula     ondansetron (ZOFRAN ODT) 4 MG disintegrating tablet Take 1 tablet (4 mg total) by mouth every 8 (eight) hours  as needed for nausea or vomiting. 20 tablet 0   oxyCODONE-acetaminophen (ROXICET) 5-325 MG tablet Take 1 tablet by mouth every 4 (four) hours as needed for severe pain. 20 tablet 0   rosuvastatin (CRESTOR) 5 MG tablet Take 5 mg by mouth daily.     sildenafil (VIAGRA) 100 MG tablet Take 100 mg by mouth daily as needed for erectile dysfunction.     triamcinolone (NASACORT) 55 MCG/ACT AERO nasal inhaler Place into the nose.     triamcinolone cream (KENALOG) 0.1 % Apply topically daily.     Yohimbe Bark (YOHIMBE PO) Take 451 capsules by mouth.     ferrous sulfate 325 (65 FE) MG EC tablet Take 1 tablet (325 mg total) by mouth 3 (three) times daily with meals. 60 tablet 2   No current facility-administered medications for this visit.     PHYSICAL EXAMINATION: Vitals:   04/17/22 1008  BP: (!) 147/85  Pulse: 79  Resp: 18  Temp: (!) 96.7 F (35.9 C)  SpO2: 99%   Filed Weights   04/17/22 1008  Weight: 216 lb 11.2 oz (98.3 kg)    Physical Exam Constitutional:      General: He is not in acute distress.    Appearance: He is obese.  HENT:     Head: Normocephalic and atraumatic.  Eyes:     General: No scleral icterus. Cardiovascular:     Rate and Rhythm: Normal rate and regular rhythm.     Heart sounds: Normal heart sounds.  Pulmonary:     Effort: Pulmonary effort is normal. No respiratory distress.     Breath sounds: No wheezing.  Abdominal:     General: Bowel sounds are normal. There is no distension.     Palpations: Abdomen is soft.  Musculoskeletal:        General: No deformity. Normal range of motion.     Cervical back: Normal range of motion and neck supple.  Skin:    General: Skin is warm and dry.     Findings: No erythema or rash.  Neurological:     Mental Status: He is alert and oriented to person, place, and time. Mental status is at baseline.     Cranial Nerves: No cranial nerve deficit.     Coordination: Coordination normal.  Psychiatric:  Mood and Affect:  Mood normal.     LABORATORY DATA:  I have reviewed the data as listed Lab Results  Component Value Date   WBC 4.5 03/27/2022   HGB 9.6 (L) 03/27/2022   HCT 31.8 (L) 03/27/2022   MCV 86.2 03/27/2022   PLT 452 (H) 03/27/2022   No results for input(s): "NA", "K", "CL", "CO2", "GLUCOSE", "BUN", "CREATININE", "CALCIUM", "GFRNONAA", "GFRAA", "PROT", "ALBUMIN", "AST", "ALT", "ALKPHOS", "BILITOT", "BILIDIR", "IBILI" in the last 8760 hours. Iron/TIBC/Ferritin/ %Sat    Component Value Date/Time   IRON 19 (L) 03/27/2022 1028   TIBC 476 (H) 03/27/2022 1028   FERRITIN 7 (L) 03/27/2022 1028   IRONPCTSAT 4 (L) 03/27/2022 1028      RADIOGRAPHIC STUDIES: I have personally reviewed the radiological images as listed and agreed with the findings in the report. No results found.

## 2022-04-17 NOTE — Progress Notes (Signed)
Pt reports occassional stomach cramping with PO iron that is relieved by drinking something. Denies taking on empty stomach.

## 2022-04-17 NOTE — Assessment & Plan Note (Signed)
Testosterone level is low.  I recommend patient to further discuss with primary care provider for management.

## 2022-04-17 NOTE — Assessment & Plan Note (Signed)
Likely secondary to iron deficiency anemia.

## 2022-04-18 ENCOUNTER — Telehealth: Payer: Self-pay

## 2022-04-18 NOTE — Telephone Encounter (Signed)
Referral placed and last encounter with demo, and insurance faxed Dr. Horace Porteous office at [facility].

## 2022-04-26 ENCOUNTER — Other Ambulatory Visit: Payer: Self-pay

## 2022-04-26 DIAGNOSIS — D509 Iron deficiency anemia, unspecified: Secondary | ICD-10-CM

## 2022-04-26 NOTE — Telephone Encounter (Signed)
Received fax from [facility] stating they will no longer accept patient's insurance after 05/22/22 and unable to get him in prior to that date. I placed new referral to [facility]

## 2022-05-08 ENCOUNTER — Other Ambulatory Visit: Payer: 59

## 2022-05-12 ENCOUNTER — Ambulatory Visit: Payer: 59 | Admitting: Urology

## 2022-05-12 ENCOUNTER — Encounter: Payer: Self-pay | Admitting: Urology

## 2022-05-12 ENCOUNTER — Inpatient Hospital Stay: Payer: 59 | Attending: Oncology

## 2022-05-12 ENCOUNTER — Telehealth: Payer: Self-pay

## 2022-05-12 ENCOUNTER — Other Ambulatory Visit: Payer: Self-pay | Admitting: Oncology

## 2022-05-12 VITALS — BP 159/84 | HR 58 | Ht 70.0 in | Wt 213.0 lb

## 2022-05-12 DIAGNOSIS — D509 Iron deficiency anemia, unspecified: Secondary | ICD-10-CM | POA: Diagnosis present

## 2022-05-12 DIAGNOSIS — R7989 Other specified abnormal findings of blood chemistry: Secondary | ICD-10-CM

## 2022-05-12 DIAGNOSIS — E291 Testicular hypofunction: Secondary | ICD-10-CM

## 2022-05-12 LAB — CBC WITH DIFFERENTIAL/PLATELET
Abs Immature Granulocytes: 0 10*3/uL (ref 0.00–0.07)
Basophils Absolute: 0 10*3/uL (ref 0.0–0.1)
Basophils Relative: 0 %
Eosinophils Absolute: 0.1 10*3/uL (ref 0.0–0.5)
Eosinophils Relative: 3 %
HCT: 35.1 % — ABNORMAL LOW (ref 39.0–52.0)
Hemoglobin: 10.8 g/dL — ABNORMAL LOW (ref 13.0–17.0)
Immature Granulocytes: 0 %
Lymphocytes Relative: 22 %
Lymphs Abs: 1.1 10*3/uL (ref 0.7–4.0)
MCH: 27.1 pg (ref 26.0–34.0)
MCHC: 30.8 g/dL (ref 30.0–36.0)
MCV: 88.2 fL (ref 80.0–100.0)
Monocytes Absolute: 0.4 10*3/uL (ref 0.1–1.0)
Monocytes Relative: 7 %
Neutro Abs: 3.5 10*3/uL (ref 1.7–7.7)
Neutrophils Relative %: 68 %
Platelets: 394 10*3/uL (ref 150–400)
RBC: 3.98 MIL/uL — ABNORMAL LOW (ref 4.22–5.81)
RDW: 16.3 % — ABNORMAL HIGH (ref 11.5–15.5)
WBC: 5.1 10*3/uL (ref 4.0–10.5)
nRBC: 0 % (ref 0.0–0.2)

## 2022-05-12 LAB — IRON AND TIBC
Iron: 75 ug/dL (ref 45–182)
Saturation Ratios: 18 % (ref 17.9–39.5)
TIBC: 420 ug/dL (ref 250–450)
UIBC: 345 ug/dL

## 2022-05-12 LAB — FERRITIN: Ferritin: 13 ng/mL — ABNORMAL LOW (ref 24–336)

## 2022-05-12 NOTE — Telephone Encounter (Signed)
Spoke to patient and he states that he thinks his iron levels are low because he hasn't been taking his iron pills like he should. He would rather continue to take oral supplements and stated that starting today he would start taking TID, but if you felt that he absolutely needs IV iron, he will proceed with scheduling.   Please advise.

## 2022-05-12 NOTE — Progress Notes (Unsigned)
05/12/2022 11:07 AM   Randy Sawyer February 11, 1961 [id number]  Referring provider: Sallee Lange, NP [place],  [place] [address]  Chief Complaint  Patient presents with   Hypogonadism    HPI: Randy Sawyer is a 61 y.o. male referred for evaluation of hypogonadism.  Being evaluated by hematology for chronic anemia and a testosterone level was checked which was low at 202 Some tiredness and fatigue but not significant Denies decreased libido On sildenafil for ED with good efficacy States if his testosterone had not been checked he would not know that it was low Has been followed by Dr. Yves Dill for an elevated PSA.  Prostate MRI June 2022 showed a 41 cc gland with no abnormality suspicious for high-grade prostate cancer and surveillance was elected.  Last PSA on Epic review was 4.88 in April 2022 though patient states his PSA was recently checked and stable No bothersome LUTS.  Prior history stone disease   PMH: Past Medical History:  Diagnosis Date   Allergy    Hypertension     Surgical History: Past Surgical History:  Procedure Laterality Date   KNEE SURGERY      Home Medications:  Allergies as of 05/12/2022   No Known Allergies      Medication List        Accurate as of May 12, 2022 11:07 AM. If you have any questions, ask your nurse or doctor.          STOP taking these medications    ondansetron 4 MG disintegrating tablet Commonly known as: Zofran ODT Stopped by: Abbie Sons, MD   oxyCODONE-acetaminophen 5-325 MG tablet Commonly known as: Roxicet Stopped by: Abbie Sons, MD       TAKE these medications    amitriptyline 10 MG tablet Commonly known as: ELAVIL Take by mouth.   ascorbic acid 1000 MG tablet Commonly known as: VITAMIN C Take by mouth.   docusate sodium 100 MG capsule Commonly known as: Colace Take 1 capsule (100 mg total) by mouth daily as needed for mild constipation.   ferrous sulfate 325 (65  FE) MG EC tablet Take 1 tablet (325 mg total) by mouth 3 (three) times daily with meals.   ibuprofen 800 MG tablet Commonly known as: ADVIL Take 1 tablet (800 mg total) by mouth every 8 (eight) hours as needed for moderate pain.   losartan 25 MG tablet Commonly known as: COZAAR Take 25 mg by mouth daily.   multivitamin capsule Take 1 capsule by mouth daily.   NON FORMULARY Alpha king supreme   NON FORMULARY Ultra Saw Palmetto formula   Oyster Shell Calcium/D 500-5 MG-MCG Tabs Take by mouth.   rosuvastatin 5 MG tablet Commonly known as: CRESTOR Take 5 mg by mouth daily.   sildenafil 100 MG tablet Commonly known as: VIAGRA Take 100 mg by mouth daily as needed for erectile dysfunction.   triamcinolone 55 MCG/ACT Aero nasal inhaler Commonly known as: NASACORT Place into the nose.   triamcinolone cream 0.1 % Commonly known as: KENALOG Apply topically daily.   YOHIMBE PO Take 451 capsules by mouth.        Allergies: No Known Allergies  Family History: Family History  Problem Relation Age of Onset   Hypertension Mother    Kidney failure Mother    Diabetes Mother    Prostate cancer Father    Diabetes Paternal Grandmother     Social History:  reports that he has quit smoking.  His smoking use included cigarettes. He has never used smokeless tobacco. He reports current alcohol use of about 1.0 standard drink of alcohol per week. He reports that he does not use drugs.   Physical Exam: BP (!) 159/84   Pulse (!) 58   Ht 5\' 10"  (1.778 m)   Wt 213 lb (96.6 kg)   BMI 30.56 kg/m   Constitutional:  Alert and oriented, No acute distress. HEENT: [place] AT Respiratory: Normal respiratory effort, no increased work of breathing. GI: Abdomen is soft, nontender, nondistended, no abdominal masses GU: Phallus without lesions.  Testes descended bilateral without masses or tenderness.  Estimated volume 20 cc bilaterally Psychiatric: Normal mood and affect.    Assessment &  Plan:    1.  Low testosterone No significant symptoms Repeat testosterone level today along with an LH We discussed TRT but he is not interested in exogenous testosterone treatment due to concerns of lowering his native testosterone production He may be interested in clomiphene which was discussed He desires to finish his anemia evaluation prior to any treatment   Abbie Sons, MD  [facility] [address], [place], [place] [address] [phone]

## 2022-05-12 NOTE — Telephone Encounter (Signed)
Informed pt that per Dr. Tasia Catchings, he will benefit from IV iron. Pt agreeable to do IV iron.   Please schedule patient for Venofer weekly x4. (pt can only do FRIDAY appt- first does is NEW)   Please cancel lab on 10/27 and add lab on 11/3 @ 8:30 am (this will allow time for labs to result prior to visit) pt requested all be done on the same day since he is off on 11/3.   Please call pt with venofer appts.

## 2022-05-12 NOTE — Telephone Encounter (Signed)
-----   Message from Earlie Server, MD sent at 05/12/2022 11:30 AM EDT ----- Please let patient know that her hemoglobin has slightly improved to 10.8 compared to 9.6 in June.  Iron panel has improved however still consistent with iron deficiency anemia. I recommend patient to consider IV Venofer weekly x 4, if he agrees please schedule. Keep same follow-up appointment.

## 2022-05-13 ENCOUNTER — Encounter: Payer: Self-pay | Admitting: Urology

## 2022-05-13 LAB — LUTEINIZING HORMONE: LH: 8.6 m[IU]/mL (ref 1.7–8.6)

## 2022-05-13 LAB — TESTOSTERONE: Testosterone: 223 ng/dL — ABNORMAL LOW (ref 264–916)

## 2022-05-14 ENCOUNTER — Encounter: Payer: Self-pay | Admitting: Urology

## 2022-05-15 ENCOUNTER — Encounter: Payer: Self-pay | Admitting: Oncology

## 2022-05-19 ENCOUNTER — Inpatient Hospital Stay: Payer: 59

## 2022-05-19 VITALS — BP 139/67 | HR 70 | Temp 98.6°F | Resp 18

## 2022-05-19 DIAGNOSIS — D508 Other iron deficiency anemias: Secondary | ICD-10-CM

## 2022-05-19 DIAGNOSIS — D509 Iron deficiency anemia, unspecified: Secondary | ICD-10-CM | POA: Diagnosis not present

## 2022-05-19 MED ORDER — SODIUM CHLORIDE 0.9 % IV SOLN
200.0000 mg | Freq: Once | INTRAVENOUS | Status: AC
  Administered 2022-05-19: 200 mg via INTRAVENOUS
  Filled 2022-05-19: qty 200

## 2022-05-19 MED ORDER — SODIUM CHLORIDE 0.9 % IV SOLN
Freq: Once | INTRAVENOUS | Status: AC
  Filled 2022-05-19: qty 250

## 2022-05-19 NOTE — Patient Instructions (Signed)

## 2022-05-26 ENCOUNTER — Inpatient Hospital Stay: Payer: 59 | Attending: Oncology

## 2022-05-26 VITALS — BP 146/62 | HR 57 | Temp 96.2°F | Resp 18

## 2022-05-26 DIAGNOSIS — D509 Iron deficiency anemia, unspecified: Secondary | ICD-10-CM | POA: Insufficient documentation

## 2022-05-26 DIAGNOSIS — Z79899 Other long term (current) drug therapy: Secondary | ICD-10-CM | POA: Insufficient documentation

## 2022-05-26 DIAGNOSIS — D508 Other iron deficiency anemias: Secondary | ICD-10-CM

## 2022-05-26 MED ORDER — SODIUM CHLORIDE 0.9 % IV SOLN
200.0000 mg | Freq: Once | INTRAVENOUS | Status: AC
  Administered 2022-05-26: 200 mg via INTRAVENOUS
  Filled 2022-05-26: qty 200

## 2022-05-26 MED ORDER — SODIUM CHLORIDE 0.9 % IV SOLN
Freq: Once | INTRAVENOUS | Status: AC
  Filled 2022-05-26: qty 250

## 2022-06-02 ENCOUNTER — Inpatient Hospital Stay: Payer: 59

## 2022-06-02 VITALS — BP 137/65 | HR 70 | Temp 96.2°F

## 2022-06-02 DIAGNOSIS — D509 Iron deficiency anemia, unspecified: Secondary | ICD-10-CM | POA: Diagnosis not present

## 2022-06-02 DIAGNOSIS — D508 Other iron deficiency anemias: Secondary | ICD-10-CM

## 2022-06-02 MED ORDER — SODIUM CHLORIDE 0.9 % IV SOLN
200.0000 mg | Freq: Once | INTRAVENOUS | Status: AC
  Administered 2022-06-02: 200 mg via INTRAVENOUS
  Filled 2022-06-02: qty 200

## 2022-06-02 MED ORDER — SODIUM CHLORIDE 0.9 % IV SOLN
Freq: Once | INTRAVENOUS | Status: AC
  Filled 2022-06-02: qty 250

## 2022-06-02 MED ORDER — SODIUM CHLORIDE 0.9% FLUSH
10.0000 mL | Freq: Once | INTRAVENOUS | Status: AC | PRN
  Administered 2022-06-02: 10 mL
  Filled 2022-06-02: qty 10

## 2022-06-02 NOTE — Patient Instructions (Signed)

## 2022-06-09 ENCOUNTER — Inpatient Hospital Stay: Payer: 59

## 2022-06-09 VITALS — BP 145/78 | HR 60 | Temp 96.0°F | Resp 17

## 2022-06-09 DIAGNOSIS — D508 Other iron deficiency anemias: Secondary | ICD-10-CM

## 2022-06-09 DIAGNOSIS — D509 Iron deficiency anemia, unspecified: Secondary | ICD-10-CM | POA: Diagnosis not present

## 2022-06-09 MED ORDER — SODIUM CHLORIDE 0.9 % IV SOLN
Freq: Once | INTRAVENOUS | Status: AC
  Filled 2022-06-09: qty 250

## 2022-06-09 MED ORDER — SODIUM CHLORIDE 0.9% FLUSH
10.0000 mL | Freq: Once | INTRAVENOUS | Status: AC | PRN
  Administered 2022-06-09: 10 mL
  Filled 2022-06-09: qty 10

## 2022-06-09 MED ORDER — SODIUM CHLORIDE 0.9 % IV SOLN
200.0000 mg | Freq: Once | INTRAVENOUS | Status: AC
  Administered 2022-06-09: 200 mg via INTRAVENOUS
  Filled 2022-06-09: qty 200

## 2022-08-18 ENCOUNTER — Other Ambulatory Visit: Payer: 59

## 2022-08-21 ENCOUNTER — Encounter: Payer: Self-pay | Admitting: Oncology

## 2022-08-22 ENCOUNTER — Encounter: Payer: Self-pay | Admitting: Oncology

## 2022-08-24 MED FILL — Iron Sucrose Inj 20 MG/ML (Fe Equiv): INTRAVENOUS | Qty: 10 | Status: AC

## 2022-08-25 ENCOUNTER — Encounter: Payer: Self-pay | Admitting: Oncology

## 2022-08-25 ENCOUNTER — Inpatient Hospital Stay: Payer: Commercial Managed Care - HMO

## 2022-08-25 ENCOUNTER — Inpatient Hospital Stay: Payer: Commercial Managed Care - HMO | Attending: Oncology | Admitting: Oncology

## 2022-08-25 VITALS — BP 140/86 | HR 60 | Temp 96.7°F | Resp 18 | Wt 218.8 lb

## 2022-08-25 DIAGNOSIS — E291 Testicular hypofunction: Secondary | ICD-10-CM | POA: Diagnosis not present

## 2022-08-25 DIAGNOSIS — D75839 Thrombocytosis, unspecified: Secondary | ICD-10-CM | POA: Diagnosis not present

## 2022-08-25 DIAGNOSIS — D508 Other iron deficiency anemias: Secondary | ICD-10-CM

## 2022-08-25 DIAGNOSIS — Z87891 Personal history of nicotine dependence: Secondary | ICD-10-CM | POA: Insufficient documentation

## 2022-08-25 DIAGNOSIS — K921 Melena: Secondary | ICD-10-CM | POA: Insufficient documentation

## 2022-08-25 DIAGNOSIS — Z8042 Family history of malignant neoplasm of prostate: Secondary | ICD-10-CM | POA: Insufficient documentation

## 2022-08-25 DIAGNOSIS — Z79899 Other long term (current) drug therapy: Secondary | ICD-10-CM | POA: Diagnosis not present

## 2022-08-25 DIAGNOSIS — D509 Iron deficiency anemia, unspecified: Secondary | ICD-10-CM | POA: Insufficient documentation

## 2022-08-25 DIAGNOSIS — I10 Essential (primary) hypertension: Secondary | ICD-10-CM | POA: Diagnosis not present

## 2022-08-25 LAB — CBC WITH DIFFERENTIAL/PLATELET
Abs Immature Granulocytes: 0.01 10*3/uL (ref 0.00–0.07)
Basophils Absolute: 0 10*3/uL (ref 0.0–0.1)
Basophils Relative: 0 %
Eosinophils Absolute: 0.1 10*3/uL (ref 0.0–0.5)
Eosinophils Relative: 2 %
HCT: 36.3 % — ABNORMAL LOW (ref 39.0–52.0)
Hemoglobin: 11.9 g/dL — ABNORMAL LOW (ref 13.0–17.0)
Immature Granulocytes: 0 %
Lymphocytes Relative: 19 %
Lymphs Abs: 1 10*3/uL (ref 0.7–4.0)
MCH: 29.4 pg (ref 26.0–34.0)
MCHC: 32.8 g/dL (ref 30.0–36.0)
MCV: 89.6 fL (ref 80.0–100.0)
Monocytes Absolute: 0.3 10*3/uL (ref 0.1–1.0)
Monocytes Relative: 5 %
Neutro Abs: 3.9 10*3/uL (ref 1.7–7.7)
Neutrophils Relative %: 74 %
Platelets: 373 10*3/uL (ref 150–400)
RBC: 4.05 MIL/uL — ABNORMAL LOW (ref 4.22–5.81)
RDW: 12.6 % (ref 11.5–15.5)
WBC: 5.3 10*3/uL (ref 4.0–10.5)
nRBC: 0 % (ref 0.0–0.2)

## 2022-08-25 LAB — IRON AND TIBC
Iron: 82 ug/dL (ref 45–182)
Saturation Ratios: 22 % (ref 17.9–39.5)
TIBC: 367 ug/dL (ref 250–450)
UIBC: 285 ug/dL

## 2022-08-25 LAB — RETIC PANEL
Immature Retic Fract: 10.6 % (ref 2.3–15.9)
RBC.: 4.08 MIL/uL — ABNORMAL LOW (ref 4.22–5.81)
Retic Count, Absolute: 54.7 10*3/uL (ref 19.0–186.0)
Retic Ct Pct: 1.3 % (ref 0.4–3.1)
Reticulocyte Hemoglobin: 32.6 pg (ref >27.9)

## 2022-08-25 LAB — FERRITIN: Ferritin: 85 ng/mL (ref 24–336)

## 2022-08-25 NOTE — Progress Notes (Unsigned)
Patient here for follow up. Pt reports stiffness to thumb joints in the mornings. He would like to know if any medication may be causing this.

## 2022-08-26 ENCOUNTER — Encounter: Payer: Self-pay | Admitting: Oncology

## 2022-08-26 NOTE — Progress Notes (Signed)
Hematology/Oncology Progress note [phone] [phone]            Patient Care Team: Sallee Lange, NP as PCP - General (Internal Medicine)  ASSESSMENT & PLAN:   IDA (iron deficiency anemia) Labs are reviewed and discussed with patient. Hemoglobin has improved.  Iron panel has normalized.    Thrombocytosis Resolved.   Hypogonadism in male Follow uo with urology for further management.   Blood in stool He has upcoming appt with GI for further evaluation.   Follow up in 6 months- lab prior to MD + venofer. Iron labs retic panel.   Orders Placed This Encounter  Procedures   CBC with Differential/Platelet    Standing Status:   Future    Standing Expiration Date:   08/26/2023   Iron and TIBC    Standing Status:   Future    Standing Expiration Date:   08/26/2023   Ferritin    Standing Status:   Future    Standing Expiration Date:   08/26/2023    All questions were answered. The patient knows to call the clinic with any problems, questions or concerns.  Earlie Server, MD, PhD [facility] Hematology Oncology 08/25/2022   CHIEF COMPLAINTS/REASON FOR VISIT:  Follow-up for iron deficiency anemia  HISTORY OF PRESENTING ILLNESS:   Randy Sawyer is a  61 y.o.  male presents for follow up of anemia  Patient reports that he was previously evaluated by hematologist years ago and was told everything was fine. Patient has a chronically decreased hemoglobin, dated back to at least 2015.  His baseline is around 11 02/20/2022, hemoglobin has decreased to 9.9, MCV 89.2.  Patient was referred to establish care with hematology for further evaluation. Patient reports chronic history of intermittent blood in the stool.  He reports a colonoscopy in 6 to 7 years, not available in current EMR. Denies any unintentional weight loss, fever, night sweats. He is also over-the-counter supplements including alpha king supreme, -testpsteron booster, yohimbe 451 ultra  saw palmetto .  Since his last blood work, he has stopped testosterone. Patient drinks occasionally.  .INTERVAL HISTORY Randy Sawyer is a 61 y.o. male who has above history reviewed by me today presents for follow up visit for management of iron deficiency anemia Patient reports feeling well. Energy level has improved. No other new complaints.  He takes oral iron supplementation.    Review of Systems  Constitutional:  Negative for appetite change, chills, fatigue, fever and unexpected weight change.  HENT:   Negative for hearing loss and voice change.   Eyes:  Negative for eye problems and icterus.  Respiratory:  Negative for chest tightness, cough and shortness of breath.   Cardiovascular:  Negative for chest pain and leg swelling.  Gastrointestinal:  Negative for abdominal distention and abdominal pain.  Endocrine: Negative for hot flashes.  Genitourinary:  Negative for difficulty urinating, dysuria and frequency.   Musculoskeletal:  Negative for arthralgias.  Skin:  Negative for itching and rash.  Neurological:  Negative for light-headedness and numbness.  Hematological:  Negative for adenopathy. Does not bruise/bleed easily.  Psychiatric/Behavioral:  Negative for confusion.     MEDICAL HISTORY:  Past Medical History:  Diagnosis Date   Allergy    Hypertension     SURGICAL HISTORY: Past Surgical History:  Procedure Laterality Date   KNEE SURGERY      SOCIAL HISTORY: Social History   Socioeconomic History   Marital status: Single    Spouse name: Not on  file   Number of children: Not on file   Years of education: Not on file   Highest education level: Not on file  Occupational History   Not on file  Tobacco Use   Smoking status: Former    Types: Cigarettes   Smokeless tobacco: Never  Substance and Sexual Activity   Alcohol use: Yes    Alcohol/week: 1.0 standard drink of alcohol    Types: 1 Glasses of wine per week    Comment: occationally.   Drug use: Never    Sexual activity: Yes  Other Topics Concern   Not on file  Social History Narrative   Not on file   Social Determinants of Health   Financial Resource Strain: Not on file  Food Insecurity: Not on file  Transportation Needs: Not on file  Physical Activity: Not on file  Stress: Not on file  Social Connections: Not on file  Intimate Partner Violence: Not on file    FAMILY HISTORY: Family History  Problem Relation Age of Onset   Hypertension Mother    Kidney failure Mother    Diabetes Mother    Prostate cancer Father    Diabetes Paternal Grandmother     ALLERGIES:  has No Known Allergies.  MEDICATIONS:  Current Outpatient Medications  Medication Sig Dispense Refill   amitriptyline (ELAVIL) 10 MG tablet Take by mouth.     ascorbic acid (VITAMIN C) 1000 MG tablet Take by mouth.     Calcium Carbonate-Vitamin D (OYSTER SHELL CALCIUM/D) 500-5 MG-MCG TABS Take by mouth.     ibuprofen (ADVIL,MOTRIN) 800 MG tablet Take 1 tablet (800 mg total) by mouth every 8 (eight) hours as needed for moderate pain. 30 tablet 0   losartan (COZAAR) 25 MG tablet Take 25 mg by mouth daily.     Multiple Vitamin (MULTIVITAMIN) capsule Take 1 capsule by mouth daily.     NON FORMULARY Ultra Saw Palmetto formula     pantoprazole (PROTONIX) 20 MG tablet Take 20 mg by mouth daily.     rosuvastatin (CRESTOR) 5 MG tablet Take 5 mg by mouth daily.     triamcinolone (NASACORT) 55 MCG/ACT AERO nasal inhaler Place into the nose.     triamcinolone cream (KENALOG) 0.1 % Apply topically daily.     docusate sodium (COLACE) 100 MG capsule Take 1 capsule (100 mg total) by mouth daily as needed for mild constipation. (Patient not taking: Reported on 08/25/2022) 30 capsule 2   ferrous sulfate 325 (65 FE) MG EC tablet Take 1 tablet (325 mg total) by mouth 3 (three) times daily with meals. (Patient not taking: Reported on 08/25/2022) 60 tablet 2   NON FORMULARY Alpha king supreme (Patient not taking: Reported on 08/25/2022)      Yohimbe Bark (YOHIMBE PO) Take 451 capsules by mouth. (Patient not taking: Reported on 08/25/2022)     No current facility-administered medications for this visit.     PHYSICAL EXAMINATION: Vitals:   08/25/22 1212  BP: (!) 140/86  Pulse: 60  Resp: 18  Temp: (!) 96.7 F (35.9 C)   Filed Weights   08/25/22 1212  Weight: 218 lb 12.8 oz (99.2 kg)    Physical Exam Constitutional:      General: He is not in acute distress.    Appearance: He is obese.  HENT:     Head: Normocephalic and atraumatic.  Eyes:     General: No scleral icterus. Cardiovascular:     Rate and Rhythm: Normal rate and regular rhythm.  Heart sounds: Normal heart sounds.  Pulmonary:     Effort: Pulmonary effort is normal. No respiratory distress.     Breath sounds: No wheezing.  Abdominal:     General: Bowel sounds are normal. There is no distension.     Palpations: Abdomen is soft.  Musculoskeletal:        General: No deformity. Normal range of motion.     Cervical back: Normal range of motion and neck supple.  Skin:    General: Skin is warm and dry.     Findings: No erythema or rash.  Neurological:     Mental Status: He is alert and oriented to person, place, and time. Mental status is at baseline.     Cranial Nerves: No cranial nerve deficit.     Coordination: Coordination normal.  Psychiatric:        Mood and Affect: Mood normal.     LABORATORY DATA:  I have reviewed the data as listed Lab Results  Component Value Date   WBC 5.3 08/25/2022   HGB 11.9 (L) 08/25/2022   HCT 36.3 (L) 08/25/2022   MCV 89.6 08/25/2022   PLT 373 08/25/2022   No results for input(s): "NA", "K", "CL", "CO2", "GLUCOSE", "BUN", "CREATININE", "CALCIUM", "GFRNONAA", "GFRAA", "PROT", "ALBUMIN", "AST", "ALT", "ALKPHOS", "BILITOT", "BILIDIR", "IBILI" in the last 8760 hours. Iron/TIBC/Ferritin/ %Sat    Component Value Date/Time   IRON 82 08/25/2022 0842   TIBC 367 08/25/2022 0842   FERRITIN 85 08/25/2022 0842    IRONPCTSAT 22 08/25/2022 0842      RADIOGRAPHIC STUDIES: I have personally reviewed the radiological images as listed and agreed with the findings in the report. No results found.

## 2022-08-26 NOTE — Assessment & Plan Note (Signed)
He has upcoming appt with GI for further evaluation.

## 2022-08-26 NOTE — Assessment & Plan Note (Signed)
Follow uo with urology for further management.

## 2022-08-26 NOTE — Assessment & Plan Note (Signed)
Labs are reviewed and discussed with patient. Hemoglobin has improved.  Iron panel has normalized.

## 2022-08-26 NOTE — Assessment & Plan Note (Signed)
Resolved

## 2022-09-07 ENCOUNTER — Encounter: Payer: Self-pay | Admitting: Gastroenterology

## 2022-09-07 ENCOUNTER — Ambulatory Visit (INDEPENDENT_AMBULATORY_CARE_PROVIDER_SITE_OTHER): Payer: Commercial Managed Care - HMO | Admitting: Gastroenterology

## 2022-09-07 VITALS — BP 128/84 | HR 88 | Temp 98.7°F | Ht 70.0 in | Wt 220.0 lb

## 2022-09-07 DIAGNOSIS — D500 Iron deficiency anemia secondary to blood loss (chronic): Secondary | ICD-10-CM | POA: Diagnosis not present

## 2022-09-07 MED ORDER — NA SULFATE-K SULFATE-MG SULF 17.5-3.13-1.6 GM/177ML PO SOLN: 1.0000 | Freq: Once | ORAL | 0 refills | Status: AC

## 2022-09-07 NOTE — Progress Notes (Signed)
Gastroenterology Consultation  Referring Provider:     Earlie Server, MD Primary Care Physician:  Sallee Lange, NP Primary Gastroenterologist:  Dr. Allen Norris     Reason for Consultation:     Iron deficiency anemia        HPI:   Randy Sawyer is a 61 y.o. y/o male referred for consultation & management of iron deficiency anemia by Dr. Dayton Martes, Victoriano Lain, NP.  This patient comes in today with a history of iron deficiency anemia with low hemoglobin in addition to low ferritin and iron saturation.  The patient was treated with iron and the levels improved.  The patient was sent to me for evaluation for possible GI cause of the iron deficiency anemia. The patient reports that his hemoglobin has been running low for most of his life and he states that his primary care provider told him years ago that he is usually normally one-point lower than every body else.  He does not report that he has had a history of iron deficiency in the past.  The patient does report that he has some hemorrhoidal bleeding at times but otherwise no black or bloody stools.  There is no report of any unexplained weight loss fevers chills nausea or vomiting.  Past Medical History:  Diagnosis Date   Allergy    Hypertension     Past Surgical History:  Procedure Laterality Date   KNEE SURGERY      Prior to Admission medications   Medication Sig Start Date End Date Taking? Authorizing Provider  amitriptyline (ELAVIL) 10 MG tablet Take by mouth. 02/15/22   [provider]  ascorbic acid (VITAMIN C) 1000 MG tablet Take by mouth.    [provider]  Calcium Carbonate-Vitamin D (OYSTER SHELL CALCIUM/D) 500-5 MG-MCG TABS Take by mouth.    [provider]  docusate sodium (COLACE) 100 MG capsule Take 1 capsule (100 mg total) by mouth daily as needed for mild constipation. Patient not taking: Reported on 08/25/2022 03/27/22   Earlie Server, MD  ferrous sulfate 325 (65 FE) MG EC tablet Take 1 tablet (325  mg total) by mouth 3 (three) times daily with meals. Patient not taking: Reported on 08/25/2022 04/17/22   Earlie Server, MD  ibuprofen (ADVIL,MOTRIN) 800 MG tablet Take 1 tablet (800 mg total) by mouth every 8 (eight) hours as needed for moderate pain. 07/22/18   Domenic Moras, PA-C  losartan (COZAAR) 25 MG tablet Take 25 mg by mouth daily. 02/15/22   [provider]  Multiple Vitamin (MULTIVITAMIN) capsule Take 1 capsule by mouth daily.    [provider]  Gilford Rile king supreme Patient not taking: Reported on 08/25/2022    [provider]  NON FORMULARY Ultra Saw Palmetto formula    [provider]  pantoprazole (PROTONIX) 20 MG tablet Take 20 mg by mouth daily.    [provider]  rosuvastatin (CRESTOR) 5 MG tablet Take 5 mg by mouth daily. 02/23/22   [provider]  triamcinolone (NASACORT) 55 MCG/ACT AERO nasal inhaler Place into the nose.    [provider]  triamcinolone cream (KENALOG) 0.1 % Apply topically daily. 02/15/22   [provider]  Yohimbe Bark (YOHIMBE PO) Take 451 capsules by mouth. Patient not taking: Reported on 08/25/2022    [provider]    Family History  Problem Relation Age of Onset   Hypertension Mother    Kidney failure Mother    Diabetes Mother  Prostate cancer Father    Diabetes Paternal Grandmother      Social History   Tobacco Use   Smoking status: Former    Types: Cigarettes   Smokeless tobacco: Never  Substance Use Topics   Alcohol use: Yes    Alcohol/week: 1.0 standard drink of alcohol    Types: 1 Glasses of wine per week    Comment: occationally.   Drug use: Never    Allergies as of 09/07/2022   (No Known Allergies)    Review of Systems:    All systems reviewed and negative except where noted in HPI.   Physical Exam:  There were no vitals taken for this visit. No LMP for male patient. General:   Alert,  Well-developed, well-nourished, pleasant and  cooperative in NAD Head:  Normocephalic and atraumatic. Eyes:  Sclera clear, no icterus.   Conjunctiva pink. Ears:  Normal auditory acuity. Neck:  Supple; no masses or thyromegaly. Lungs:  Respirations even and unlabored.  Clear throughout to auscultation.   No wheezes, crackles, or rhonchi. No acute distress. Heart:  Regular rate and rhythm; no murmurs, clicks, rubs, or gallops. Abdomen:  Normal bowel sounds.  No bruits.  Soft, non-tender and non-distended without masses, hepatosplenomegaly or hernias noted.  No guarding or rebound tenderness.  Negative Carnett sign.   Rectal:  Deferred.  Pulses:  Normal pulses noted. Extremities:  No clubbing or edema.  No cyanosis. Neurologic:  Alert and oriented x3;  grossly normal neurologically. Skin:  Intact without significant lesions or rashes.  No jaundice. Lymph Nodes:  No significant cervical adenopathy. Psych:  Alert and cooperative. Normal mood and affect.  Imaging Studies: No results found.  Assessment and Plan:   Randy Sawyer is a 61 y.o. y/o male who comes in today with a significant iron deficiency with a good response with iron supplementation.  The patient has been told the pathophysiology of iron deficiency anemia and has been told that the work-up should consist of an upper endoscopy and colonoscopy since he has not had this done since 2014.  The patient will be set up for an EGD and colonoscopy.  The patient has been explained the plan agrees with it.    Lucilla Lame, MD. [facility]    Note: This dictation was prepared with Dragon dictation along with smaller phrase technology. Any transcriptional errors that result from this process are unintentional.

## 2022-09-07 NOTE — Addendum Note (Signed)
Addended by: Lurlean Nanny on: 09/07/2022 04:39 PM   Modules accepted: Orders

## 2022-10-09 ENCOUNTER — Telehealth: Payer: Self-pay | Admitting: Gastroenterology

## 2022-10-09 NOTE — Telephone Encounter (Signed)
Per pt phone call want to r/s Colonoscopy.

## 2022-10-10 NOTE — Telephone Encounter (Signed)
I called pt, no answer and VM not set up

## 2022-11-13 ENCOUNTER — Encounter: Payer: Self-pay | Admitting: Gastroenterology

## 2022-11-14 ENCOUNTER — Ambulatory Visit
Admission: RE | Admit: 2022-11-14 | Discharge: 2022-11-14 | Disposition: A | Discharge status: Home / Self Care | Payer: 59 | Attending: Gastroenterology | Admitting: Gastroenterology

## 2022-11-14 ENCOUNTER — Encounter
Admission: RE | Disposition: A | Discharge status: Home / Self Care | Payer: Self-pay | Source: Home / Self Care | Attending: Gastroenterology

## 2022-11-14 ENCOUNTER — Ambulatory Visit: Payer: 59 | Admitting: Certified Registered"

## 2022-11-14 ENCOUNTER — Other Ambulatory Visit: Payer: Self-pay

## 2022-11-14 ENCOUNTER — Encounter: Payer: Self-pay | Admitting: Gastroenterology

## 2022-11-14 DIAGNOSIS — K219 Gastro-esophageal reflux disease without esophagitis: Secondary | ICD-10-CM | POA: Diagnosis not present

## 2022-11-14 DIAGNOSIS — Z87891 Personal history of nicotine dependence: Secondary | ICD-10-CM | POA: Diagnosis not present

## 2022-11-14 DIAGNOSIS — D500 Iron deficiency anemia secondary to blood loss (chronic): Secondary | ICD-10-CM

## 2022-11-14 DIAGNOSIS — I10 Essential (primary) hypertension: Secondary | ICD-10-CM | POA: Diagnosis not present

## 2022-11-14 DIAGNOSIS — K641 Second degree hemorrhoids: Secondary | ICD-10-CM | POA: Insufficient documentation

## 2022-11-14 DIAGNOSIS — D509 Iron deficiency anemia, unspecified: Secondary | ICD-10-CM | POA: Insufficient documentation

## 2022-11-14 HISTORY — PX: ESOPHAGOGASTRODUODENOSCOPY: SHX5428

## 2022-11-14 HISTORY — PX: COLONOSCOPY WITH PROPOFOL: SHX5780

## 2022-11-14 SURGERY — COLONOSCOPY WITH PROPOFOL
Anesthesia: General

## 2022-11-14 MED ORDER — DEXMEDETOMIDINE HCL IN NACL 80 MCG/20ML IV SOLN
INTRAVENOUS | Status: DC | PRN
  Administered 2022-11-14: 12 ug via BUCCAL

## 2022-11-14 MED ORDER — PROPOFOL 500 MG/50ML IV EMUL
INTRAVENOUS | Status: DC | PRN
  Administered 2022-11-14: 150 ug/kg/min via INTRAVENOUS

## 2022-11-14 MED ORDER — PROPOFOL 10 MG/ML IV BOLUS
INTRAVENOUS | Status: DC | PRN
  Administered 2022-11-14: 10 mg via INTRAVENOUS
  Administered 2022-11-14: 125 mg via INTRAVENOUS
  Administered 2022-11-14: 20 mg via INTRAVENOUS
  Administered 2022-11-14: 100 mg via INTRAVENOUS
  Administered 2022-11-14: 20 mg via INTRAVENOUS

## 2022-11-14 MED ORDER — LIDOCAINE HCL (CARDIAC) PF 100 MG/5ML IV SOSY
PREFILLED_SYRINGE | INTRAVENOUS | Status: DC | PRN
  Administered 2022-11-14: 100 mg via INTRAVENOUS

## 2022-11-14 MED ORDER — SODIUM CHLORIDE 0.9 % IV SOLN: INTRAVENOUS | Status: DC

## 2022-11-14 NOTE — Op Note (Signed)
[facility] Gastroenterology Patient Name: Randy Sawyer Procedure Date: 11/14/2022 7:53 AM MRN: [id number] Account #: [id number] Date of Birth: 1961/04/28 Admit Type: Outpatient Age: 62 Room: [facility] ENDO ROOM 4 Gender: Male Note Status: Finalized Instrument Name: Prentice Docker [id number] Procedure:             Colonoscopy Indications:           Iron deficiency anemia Providers:             Midge Minium MD, MD Referring MD:          Caryl Asp (Referring MD) Medicines:             Propofol per Anesthesia Complications:         No immediate complications. Procedure:             Pre-Anesthesia Assessment:                        - Prior to the procedure, a History and Physical was                         performed, and patient medications and allergies were                         reviewed. The patient's tolerance of previous                         anesthesia was also reviewed. The risks and benefits                         of the procedure and the sedation options and risks                         were discussed with the patient. All questions were                         answered, and informed consent was obtained. Prior                         Anticoagulants: The patient has taken no anticoagulant                         or antiplatelet agents. ASA Grade Assessment: II - A                         patient with mild systemic disease. After reviewing                         the risks and benefits, the patient was deemed in                         satisfactory condition to undergo the procedure.                        After obtaining informed consent, the colonoscope was                         passed under direct vision. Throughout the procedure,  the patient's blood pressure, pulse, and oxygen                         saturations were monitored continuously. The                         Colonoscope was introduced through the anus and                          advanced to the the cecum, identified by appendiceal                         orifice and ileocecal valve. The colonoscopy was                         performed without difficulty. The patient tolerated                         the procedure well. The quality of the bowel                         preparation was adequate to identify polyps. Findings:      The perianal and digital rectal examinations were normal.      Non-bleeding internal hemorrhoids were found during retroflexion. The       hemorrhoids were Grade II (internal hemorrhoids that prolapse but reduce       spontaneously). Impression:            - Non-bleeding internal hemorrhoids.                        - No specimens collected. Recommendation:        - Discharge patient to home.                        - Resume previous diet.                        - To visualize the small bowel, perform video capsule                         endoscopy at appointment to be scheduled. Procedure Code(s):     --- Professional ---                        [phone], Colonoscopy, flexible; diagnostic, including                         collection of specimen(s) by brushing or washing, when                         performed (separate procedure) Diagnosis Code(s):     --- Professional ---                        D50.9, Iron deficiency anemia, unspecified CPT copyright 2022 American Medical Association. All rights reserved. The codes documented in this report are preliminary and upon coder review may  be revised to meet current compliance requirements. Midge Minium MD, MD 11/14/2022 8:25:02 AM This report has been signed electronically. Number of Addenda: 0 Note Initiated  On: 11/14/2022 7:53 AM Scope Withdrawal Time: 0 hours 4 minutes 42 seconds  Total Procedure Duration: 0 hours 9 minutes 22 seconds  Estimated Blood Loss:  Estimated blood loss: none.      [facility]

## 2022-11-14 NOTE — Op Note (Signed)
[facility] Gastroenterology Patient Name: Randy Sawyer Procedure Date: 11/14/2022 7:54 AM MRN: [id number] Account #: [id number] Date of Birth: [birth year] Admit Type: Outpatient Age: 62 Room: [facility] ENDO ROOM 4 Gender: Male Note Status: Finalized Instrument Name: Upper Endoscope [id number] Procedure:             Upper GI endoscopy Indications:           Iron deficiency anemia Providers:             Midge Minium MD, MD Referring MD:          Caryl Asp (Referring MD) Medicines:             Propofol per Anesthesia Complications:         No immediate complications. Procedure:             Pre-Anesthesia Assessment:                        - Prior to the procedure, a History and Physical was                         performed, and patient medications and allergies were                         reviewed. The patient's tolerance of previous                         anesthesia was also reviewed. The risks and benefits                         of the procedure and the sedation options and risks                         were discussed with the patient. All questions were                         answered, and informed consent was obtained. Prior                         Anticoagulants: The patient has taken no anticoagulant                         or antiplatelet agents. ASA Grade Assessment: II - A                         patient with mild systemic disease. After reviewing                         the risks and benefits, the patient was deemed in                         satisfactory condition to undergo the procedure.                        After obtaining informed consent, the endoscope was                         passed under direct vision. Throughout the procedure,  the patient's blood pressure, pulse, and oxygen                         saturations were monitored continuously. The Endoscope                         was introduced through the mouth, and  advanced to the                         second part of duodenum. The upper GI endoscopy was                         accomplished without difficulty. The patient tolerated                         the procedure well. Findings:      The examined esophagus was normal.      The stomach was normal.      The examined duodenum was normal. Impression:            - Normal esophagus.                        - Normal stomach.                        - Normal examined duodenum.                        - No specimens collected. Recommendation:        - Discharge patient to home.                        - Resume previous diet.                        - Continue present medications.                        - Perform a colonoscopy today. Procedure Code(s):     --- Professional ---                        [phone], Esophagogastroduodenoscopy, flexible,                         transoral; diagnostic, including collection of                         specimen(s) by brushing or washing, when performed                         (separate procedure) Diagnosis Code(s):     --- Professional ---                        D50.9, Iron deficiency anemia, unspecified CPT copyright 2022 American Medical Association. All rights reserved. The codes documented in this report are preliminary and upon coder review may  be revised to meet current compliance requirements. Lucilla Lame MD, MD 11/14/2022 8:11:01 AM This report has been signed electronically. Number of Addenda: 0 Note Initiated On: 11/14/2022 7:54 AM Estimated Blood Loss:  Estimated blood loss: none.  [facility]

## 2022-11-14 NOTE — Anesthesia Procedure Notes (Signed)
Procedure Name: MAC Date/Time: 11/14/2022 8:03 AM  Performed by: Biagio Borg, CRNAPre-anesthesia Checklist: Patient identified, Emergency Drugs available, Suction available, Patient being monitored and Timeout performed Patient Re-evaluated:Patient Re-evaluated prior to induction Oxygen Delivery Method: Nasal cannula Induction Type: IV induction Placement Confirmation: positive ETCO2 and CO2 detector

## 2022-11-14 NOTE — H&P (Signed)
Midge Minium, MD [facility] [address] [place], [place] [address] Phone:[phone] Fax : [phone]  Primary Care Physician:  Myrene Buddy, NP Primary Gastroenterologist:  Dr. Servando Snare  Pre-Procedure History & Physical: HPI:  Randy Sawyer is a 62 y.o. male is here for an endoscopy and colonoscopy.   Past Medical History:  Diagnosis Date   Allergy    Hypertension     Past Surgical History:  Procedure Laterality Date   KNEE SURGERY      Prior to Admission medications   Medication Sig Start Date End Date Taking? Authorizing Provider  amitriptyline (ELAVIL) 10 MG tablet Take by mouth. 02/15/22  Yes [provider]  ascorbic acid (VITAMIN C) 1000 MG tablet Take by mouth.   Yes [provider]  docusate sodium (COLACE) 100 MG capsule Take 1 capsule (100 mg total) by mouth daily as needed for mild constipation. 03/27/22  Yes Rickard Patience, MD  ferrous sulfate 325 (65 FE) MG EC tablet Take 1 tablet (325 mg total) by mouth 3 (three) times daily with meals. 04/17/22  Yes Rickard Patience, MD  losartan (COZAAR) 25 MG tablet Take 25 mg by mouth daily. 02/15/22  Yes [provider]  Multiple Vitamin (MULTIVITAMIN) capsule Take 1 capsule by mouth daily.   Yes [provider]  NON FORMULARY Alpha king supreme   Yes [provider]  NON FORMULARY Ultra Saw Palmetto formula   Yes [provider]  pantoprazole (PROTONIX) 20 MG tablet Take 20 mg by mouth daily.   Yes [provider]  rosuvastatin (CRESTOR) 5 MG tablet Take 5 mg by mouth daily. 02/23/22  Yes [provider]  triamcinolone (NASACORT) 55 MCG/ACT AERO nasal inhaler Place into the nose.   Yes [provider]  triamcinolone cream (KENALOG) 0.1 % Apply topically daily. 02/15/22  Yes [provider]    Allergies as of 09/08/2022   (No Known Allergies)    Family History  Problem Relation Age of Onset   Hypertension Mother    Kidney failure  Mother    Diabetes Mother    Prostate cancer Father    Diabetes Paternal Grandmother     Social History   Socioeconomic History   Marital status: Single    Spouse name: Not on file   Number of children: Not on file   Years of education: Not on file   Highest education level: Not on file  Occupational History   Not on file  Tobacco Use   Smoking status: Former    Types: Cigarettes    Quit date: 1998    Years since quitting: 26.0   Smokeless tobacco: Never  Vaping Use   Vaping Use: Never used  Substance and Sexual Activity   Alcohol use: Yes    Alcohol/week: 1.0 standard drink of alcohol    Types: 1 Glasses of wine per week    Comment: occationally.   Drug use: Never   Sexual activity: Yes  Other Topics Concern   Not on file  Social History Narrative   Not on file   Social Determinants of Health   Financial Resource Strain: Not on file  Food Insecurity: Not on file  Transportation Needs: Not on file  Physical Activity: Not on file  Stress: Not on file  Social Connections: Not on file  Intimate Partner Violence: Not on file    Review of Systems: See HPI, otherwise negative ROS  Physical Exam: BP (!) 161/93   Pulse 64  Temp (!) 97 F (36.1 C) (Temporal)   Resp 18   Ht 5\' 10"  (1.778 m)   Wt 97 kg   SpO2 100%   BMI 30.68 kg/m  General:   Alert,  pleasant and cooperative in NAD Head:  Normocephalic and atraumatic. Neck:  Supple; no masses or thyromegaly. Lungs:  Clear throughout to auscultation.    Heart:  Regular rate and rhythm. Abdomen:  Soft, nontender and nondistended. Normal bowel sounds, without guarding, and without rebound.   Neurologic:  Alert and  oriented x4;  grossly normal neurologically.  Impression/Plan: Randy Sawyer is here for an endoscopy and colonoscopy to be performed for anemia  Risks, benefits, limitations, and alternatives regarding  endoscopy and colonoscopy have been reviewed with the patient.  Questions have been  answered.  All parties agreeable.   Lucilla Lame, MD  11/14/2022, 7:54 AM

## 2022-11-14 NOTE — Transfer of Care (Signed)
Immediate Anesthesia Transfer of Care Note  Patient: Randy Sawyer  Procedure(s) Performed: COLONOSCOPY WITH PROPOFOL ESOPHAGOGASTRODUODENOSCOPY (EGD)  Patient Location: PACU and Endoscopy Unit  Anesthesia Type:General  Level of Consciousness: sedated  Airway & Oxygen Therapy: Patient Spontanous Breathing  Post-op Assessment: Report given to RN and Post -op Vital signs reviewed and stable  Post vital signs: Reviewed and stable  Last Vitals:  Vitals Value Taken Time  BP 120/76 11/14/22 0827  Temp    Pulse 71 11/14/22 0827  Resp 15 11/14/22 0827  SpO2 97 % 11/14/22 0827  Vitals shown include unvalidated device data.  Last Pain:  Vitals:   11/14/22 0826  TempSrc:   PainSc: 0-No pain         Complications: No notable events documented.

## 2022-11-14 NOTE — Anesthesia Preprocedure Evaluation (Signed)
Anesthesia Evaluation  Patient identified by MRN, date of birth, ID band Patient awake    Reviewed: Allergy & Precautions, NPO status , Patient's Chart, lab work & pertinent test results  History of Anesthesia Complications Negative for: history of anesthetic complications  Airway Mallampati: II  TM Distance: >3 FB Neck ROM: Full    Dental no notable dental hx. (+) Teeth Intact   Pulmonary neg pulmonary ROS, neg sleep apnea, neg COPD, Patient abstained from smoking.Not current smoker, former smoker   Pulmonary exam normal breath sounds clear to auscultation       Cardiovascular Exercise Tolerance: Good METShypertension, Pt. on medications (-) CAD and (-) Past MI (-) dysrhythmias  Rhythm:Regular Rate:Normal - Systolic murmurs Patient missed two days of antihypertensives   Neuro/Psych negative neurological ROS  negative psych ROS   GI/Hepatic ,GERD  Medicated,,(+)     (-) substance abuse  Missed two days of  GERD meds. Feels asymptomatic today   Endo/Other  neg diabetes    Renal/GU negative Renal ROS     Musculoskeletal   Abdominal   Peds  Hematology   Anesthesia Other Findings Past Medical History: No date: Allergy No date: Hypertension  Reproductive/Obstetrics                             Anesthesia Physical Anesthesia Plan  ASA: 2  Anesthesia Plan: General   Post-op Pain Management: Minimal or no pain anticipated   Induction: Intravenous  PONV Risk Score and Plan: 2 and Propofol infusion, TIVA and Ondansetron  Airway Management Planned: Nasal Cannula  Additional Equipment: None  Intra-op Plan:   Post-operative Plan:   Informed Consent: I have reviewed the patients History and Physical, chart, labs and discussed the procedure including the risks, benefits and alternatives for the proposed anesthesia with the patient or authorized representative who has indicated his/her  understanding and acceptance.     Dental advisory given  Plan Discussed with: CRNA and Surgeon  Anesthesia Plan Comments: (Discussed risks of anesthesia with patient, including possibility of difficulty with spontaneous ventilation under anesthesia necessitating airway intervention, PONV, and rare risks such as cardiac or respiratory or neurological events, and allergic reactions. Discussed the role of CRNA in patient's perioperative care. Patient understands.)       Anesthesia Quick Evaluation

## 2022-11-14 NOTE — Anesthesia Postprocedure Evaluation (Signed)
Anesthesia Post Note  Patient: Randy Sawyer  Procedure(s) Performed: COLONOSCOPY WITH PROPOFOL ESOPHAGOGASTRODUODENOSCOPY (EGD)  Patient location during evaluation: Endoscopy Anesthesia Type: General Level of consciousness: awake and alert Pain management: pain level controlled Vital Signs Assessment: post-procedure vital signs reviewed and stable Respiratory status: spontaneous breathing, nonlabored ventilation, respiratory function stable and patient connected to nasal cannula oxygen Cardiovascular status: blood pressure returned to baseline and stable Postop Assessment: no apparent nausea or vomiting Anesthetic complications: no   No notable events documented.   Last Vitals:  Vitals:   11/14/22 0749 11/14/22 0826  BP: (!) 161/93 120/76  Pulse: 64 69  Resp: 18 16  Temp: (!) 36.1 C   SpO2: 100%     Last Pain:  Vitals:   11/14/22 0833  TempSrc:   PainSc: 0-No pain                 Corinda Gubler

## 2022-11-15 ENCOUNTER — Encounter: Payer: Self-pay | Admitting: Gastroenterology

## 2022-12-05 ENCOUNTER — Telehealth: Payer: Self-pay

## 2022-12-05 NOTE — Telephone Encounter (Signed)
Schedule capsule study

## 2022-12-14 ENCOUNTER — Encounter: Payer: Self-pay | Admitting: Oncology

## 2022-12-14 IMAGING — MR MR PROSTATE WO/W CM
56 series · 56 of 56 positions shown · IV contrast (10ml Gadavist)
Comparison: None.

CLINICAL DATA: Elevated PSA.

EXAM:
MR PROSTATE WITHOUT AND WITH CONTRAST
TECHNIQUE: Multiplanar multisequence MRI images were obtained of the pelvis
centered about the prostate. Pre and post contrast images were
obtained.
CONTRAST:  10mL GADAVIST GADOBUTROL 1 MMOL/ML IV SOLN

[Series 3: ax in&out whole · axial · 6.0mm · 0.74mm/px · 1 of 45 slices shown (1 of 2)]
[im 1/45]
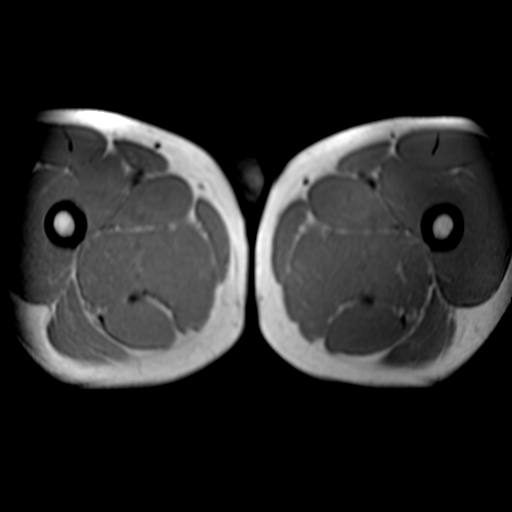

[Series 3: ax in&out whole · axial · 6.0mm · 0.74mm/px · 1 of 45 slices shown (2 of 2)]
[im 1/45]
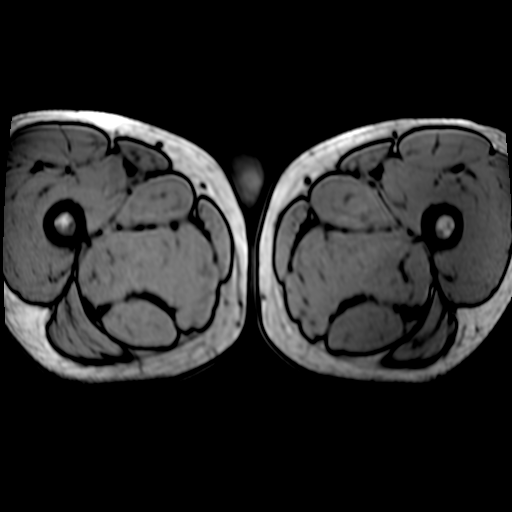

[Series 4: T2 · axial · 3.0mm · 0.56mm/px · 1 of 25 slices shown (1 of 3)]
[im 1/25]
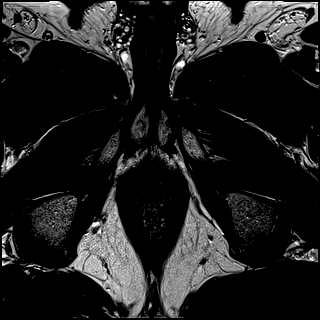

[Series 5: T2 · coronal · 3.0mm · 0.70mm/px · 1 of 35 slices shown (2 of 3)]
[im 1/35]
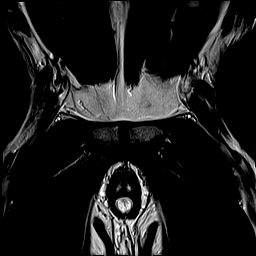

[Series 6: DWI · axial · 3.0mm · 0.86mm/px · 1 of 75 slices shown (1 of 3)]
[im 1/75]
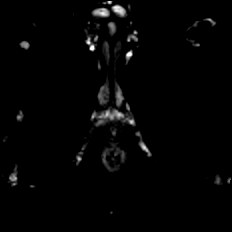

[Series 7: DWI · axial · 3.0mm · 0.86mm/px · 1 of 25 slices shown (2 of 3)]
[im 1/25]
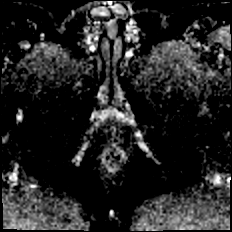

[Series 8: DWI · axial · 3.0mm · 0.86mm/px · 1 of 25 slices shown (3 of 3)]
[im 1/25]
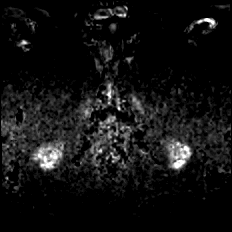

[Series 9: T2 · axial · 1.0mm · 1.04mm/px · 1 of 80 slices shown (3 of 3)]
[im 1/80]
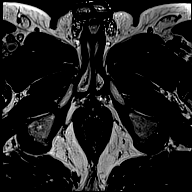

[Series 10: T1 · axial · 3.0mm · 1.15mm/px · 1 of 28 slices shown (1 of 48)]
[im 1/28]
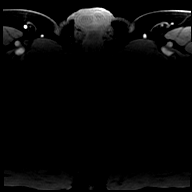

[Series 11: T1 · axial · 3.0mm · 1.15mm/px · 1 of 28 slices shown (2 of 48)]
[im 1/28]
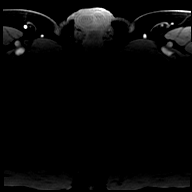

[Series 12: T1 · axial · 3.0mm · 1.15mm/px · 1 of 28 slices shown (3 of 48)]
[im 1/28]
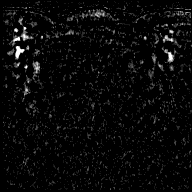

[Series 13: T1 · axial · 3.0mm · 1.15mm/px · 1 of 28 slices shown (4 of 48)]
[im 1/28]
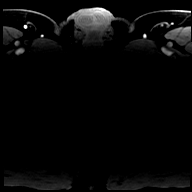

[Series 14: T1 · axial · 3.0mm · 1.15mm/px · 1 of 28 slices shown (5 of 48)]
[im 1/28]
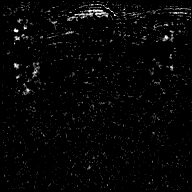

[Series 15: T1 · axial · 3.0mm · 1.15mm/px · 1 of 28 slices shown (6 of 48)]
[im 1/28]
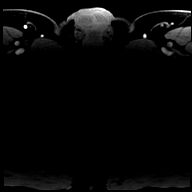

[Series 16: T1 · axial · 3.0mm · 1.15mm/px · 1 of 28 slices shown (7 of 48)]
[im 1/28]
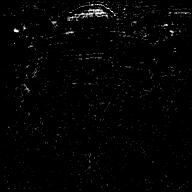

[Series 17: T1 · axial · 3.0mm · 1.15mm/px · 1 of 28 slices shown (8 of 48)]
[im 1/28]
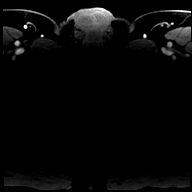

[Series 18: T1 · axial · 3.0mm · 1.15mm/px · 1 of 28 slices shown (9 of 48)]
[im 1/28]
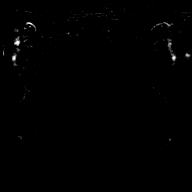

[Series 19: T1 · axial · 3.0mm · 1.15mm/px · 1 of 28 slices shown (10 of 48)]
[im 1/28]
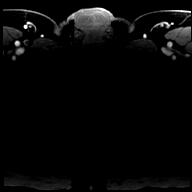

[Series 20: T1 · axial · 3.0mm · 1.15mm/px · 1 of 28 slices shown (11 of 48)]
[im 1/28]
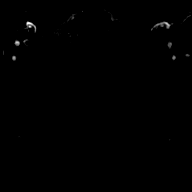

[Series 21: T1 · axial · 3.0mm · 1.15mm/px · 1 of 28 slices shown (12 of 48)]
[im 1/28]
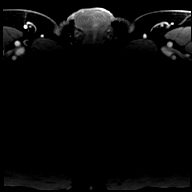

[Series 22: T1 · axial · 3.0mm · 1.15mm/px · 1 of 28 slices shown (13 of 48)]
[im 1/28]
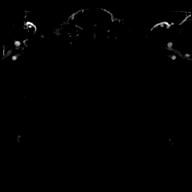

[Series 23: T1 · axial · 3.0mm · 1.15mm/px · 1 of 28 slices shown (14 of 48)]
[im 1/28]
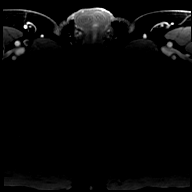

[Series 24: T1 · axial · 3.0mm · 1.15mm/px · 1 of 28 slices shown (15 of 48)]
[im 1/28]
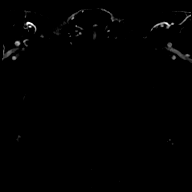

[Series 25: T1 · axial · 3.0mm · 1.15mm/px · 1 of 28 slices shown (16 of 48)]
[im 1/28]
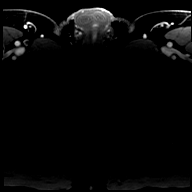

[Series 26: T1 · axial · 3.0mm · 1.15mm/px · 1 of 28 slices shown (17 of 48)]
[im 1/28]
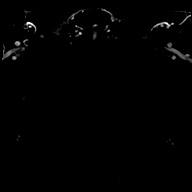

[Series 27: T1 · axial · 3.0mm · 1.15mm/px · 1 of 28 slices shown (18 of 48)]
[im 1/28]
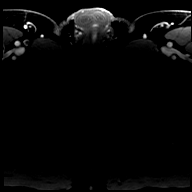

[Series 28: T1 · axial · 3.0mm · 1.15mm/px · 1 of 28 slices shown (19 of 48)]
[im 1/28]
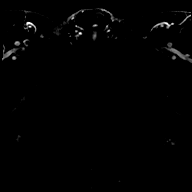

[Series 29: T1 · axial · 3.0mm · 1.15mm/px · 1 of 28 slices shown (20 of 48)]
[im 1/28]
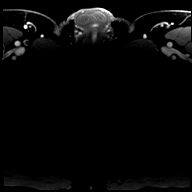

[Series 30: T1 · axial · 3.0mm · 1.15mm/px · 1 of 28 slices shown (21 of 48)]
[im 1/28]
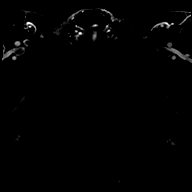

[Series 31: T1 · axial · 3.0mm · 1.15mm/px · 1 of 28 slices shown (22 of 48)]
[im 1/28]
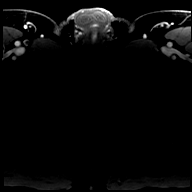

[Series 32: T1 · axial · 3.0mm · 1.15mm/px · 1 of 28 slices shown (23 of 48)]
[im 1/28]
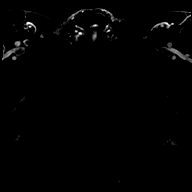

[Series 33: T1 · axial · 3.0mm · 1.15mm/px · 1 of 28 slices shown (24 of 48)]
[im 1/28]
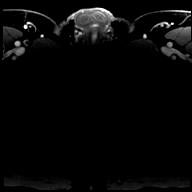

[Series 34: T1 · axial · 3.0mm · 1.15mm/px · 1 of 28 slices shown (25 of 48)]
[im 1/28]
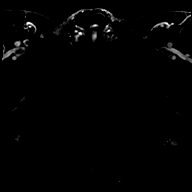

[Series 35: T1 · axial · 3.0mm · 1.15mm/px · 1 of 28 slices shown (26 of 48)]
[im 1/28]
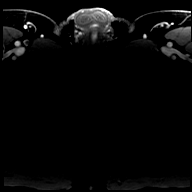

[Series 36: T1 · axial · 3.0mm · 1.15mm/px · 1 of 28 slices shown (27 of 48)]
[im 1/28]
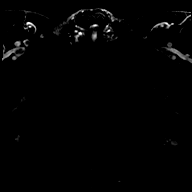

[Series 37: T1 · axial · 3.0mm · 1.15mm/px · 1 of 28 slices shown (28 of 48)]
[im 1/28]
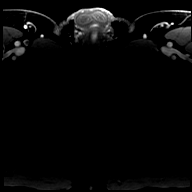

[Series 38: T1 · axial · 3.0mm · 1.15mm/px · 1 of 28 slices shown (29 of 48)]
[im 1/28]
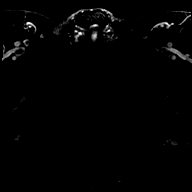

[Series 39: T1 · axial · 3.0mm · 1.15mm/px · 1 of 28 slices shown (30 of 48)]
[im 1/28]
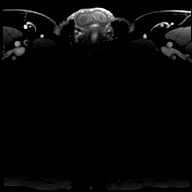

[Series 40: T1 · axial · 3.0mm · 1.15mm/px · 1 of 28 slices shown (31 of 48)]
[im 1/28]
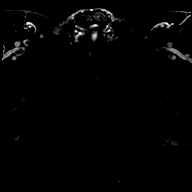

[Series 41: T1 · axial · 3.0mm · 1.15mm/px · 1 of 28 slices shown (32 of 48)]
[im 1/28]
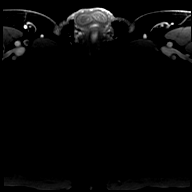

[Series 42: T1 · axial · 3.0mm · 1.15mm/px · 1 of 28 slices shown (33 of 48)]
[im 1/28]
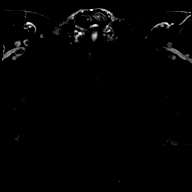

[Series 43: T1 · axial · 3.0mm · 1.15mm/px · 1 of 28 slices shown (34 of 48)]
[im 1/28]
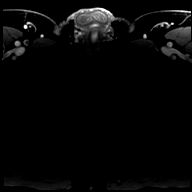

[Series 44: T1 · axial · 3.0mm · 1.15mm/px · 1 of 28 slices shown (35 of 48)]
[im 1/28]
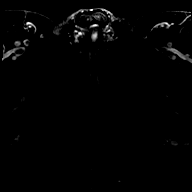

[Series 45: T1 · axial · 3.0mm · 1.15mm/px · 1 of 28 slices shown (36 of 48)]
[im 1/28]
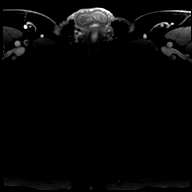

[Series 46: T1 · axial · 3.0mm · 1.15mm/px · 1 of 28 slices shown (37 of 48)]
[im 1/28]
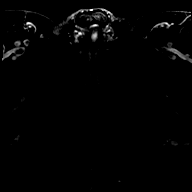

[Series 47: T1 · axial · 3.0mm · 1.15mm/px · 1 of 28 slices shown (38 of 48)]
[im 1/28]
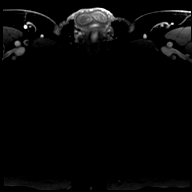

[Series 48: T1 · axial · 3.0mm · 1.15mm/px · 1 of 28 slices shown (39 of 48)]
[im 1/28]
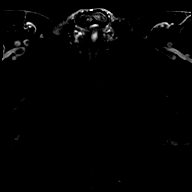

[Series 49: T1 · axial · 3.0mm · 1.15mm/px · 1 of 28 slices shown (40 of 48)]
[im 1/28]
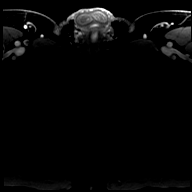

[Series 50: T1 · axial · 3.0mm · 1.15mm/px · 1 of 28 slices shown (41 of 48)]
[im 1/28]
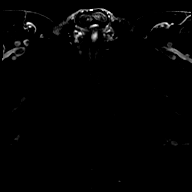

[Series 51: T1 · axial · 3.0mm · 1.15mm/px · 1 of 28 slices shown (42 of 48)]
[im 1/28]
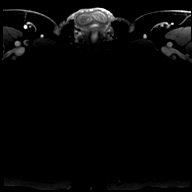

[Series 52: T1 · axial · 3.0mm · 1.15mm/px · 1 of 28 slices shown (43 of 48)]
[im 1/28]
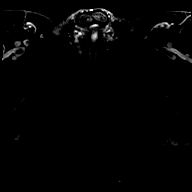

[Series 53: T1 · axial · 3.0mm · 1.15mm/px · 1 of 28 slices shown (44 of 48)]
[im 1/28]
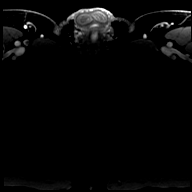

[Series 54: T1 · axial · 3.0mm · 1.15mm/px · 1 of 28 slices shown (45 of 48)]
[im 1/28]
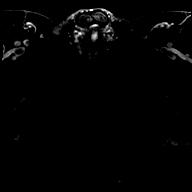

[Series 55: T1 · axial · 3.0mm · 1.15mm/px · 1 of 28 slices shown (46 of 48)]
[im 1/28]
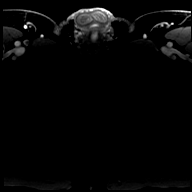

[Series 56: T1 · axial · 3.0mm · 1.15mm/px · 1 of 28 slices shown (47 of 48)]
[im 1/28]
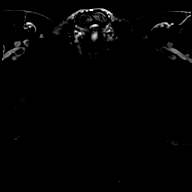

[Series 57: T1 · axial · 3.0mm · 1.15mm/px · 1 of 28 slices shown (48 of 48)]
[im 1/28]
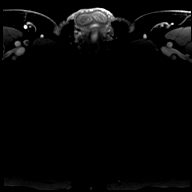

[56 of 56 positions shown; findings below may reference images not displayed]

FINDINGS: Prostate:

-- Peripheral Zone: Linear/wedge shaped hypointensities are noted on
ADC; however, no focal ADC hypointense or high b-value DWI
hyperintense nodules are identified.

-- Transition/Central Zone: Mildly enlarged with a few circumscribed
BPH nodules noted; however, no suspicious nodules with obscured
margins or restricted diffusion seen.

-- Measurements/Volume:  3.8 x 4.4 x 4.7 cm (volume = 41 cm^3)

Transcapsular spread:  Absent

Seminal vesicle involvement:  Absent

Neurovascular bundle involvement:  Absent

Pelvic adenopathy: None visualized

Bone metastasis: None visualized

Other:  None
IMPRESSION: No radiographic evidence of high-grade prostate carcinoma. PI-RADS
2: Low (clinically significant cancer is unlikely to be present)

## 2023-01-31 ENCOUNTER — Encounter: Payer: Self-pay | Admitting: Oncology

## 2023-02-22 MED FILL — Iron Sucrose Inj 20 MG/ML (Fe Equiv): INTRAVENOUS | Qty: 10 | Status: AC

## 2023-02-23 ENCOUNTER — Inpatient Hospital Stay: Payer: 59

## 2023-02-23 ENCOUNTER — Inpatient Hospital Stay: Payer: 59 | Admitting: Oncology

## 2023-03-02 ENCOUNTER — Inpatient Hospital Stay: Payer: 59

## 2023-03-02 ENCOUNTER — Inpatient Hospital Stay: Payer: 59 | Attending: Oncology | Admitting: Oncology

## 2023-03-02 ENCOUNTER — Encounter: Payer: Self-pay | Admitting: Oncology

## 2023-03-02 VITALS — BP 153/62 | HR 63 | Resp 16

## 2023-03-02 VITALS — BP 168/85 | HR 63 | Temp 97.2°F | Resp 18 | Wt 214.9 lb

## 2023-03-02 DIAGNOSIS — D500 Iron deficiency anemia secondary to blood loss (chronic): Secondary | ICD-10-CM | POA: Diagnosis not present

## 2023-03-02 DIAGNOSIS — D509 Iron deficiency anemia, unspecified: Secondary | ICD-10-CM | POA: Insufficient documentation

## 2023-03-02 DIAGNOSIS — D508 Other iron deficiency anemias: Secondary | ICD-10-CM

## 2023-03-02 DIAGNOSIS — D75839 Thrombocytosis, unspecified: Secondary | ICD-10-CM

## 2023-03-02 DIAGNOSIS — Z79899 Other long term (current) drug therapy: Secondary | ICD-10-CM | POA: Diagnosis not present

## 2023-03-02 DIAGNOSIS — K921 Melena: Secondary | ICD-10-CM | POA: Insufficient documentation

## 2023-03-02 DIAGNOSIS — K649 Unspecified hemorrhoids: Secondary | ICD-10-CM | POA: Insufficient documentation

## 2023-03-02 DIAGNOSIS — Z87891 Personal history of nicotine dependence: Secondary | ICD-10-CM | POA: Insufficient documentation

## 2023-03-02 DIAGNOSIS — I10 Essential (primary) hypertension: Secondary | ICD-10-CM | POA: Insufficient documentation

## 2023-03-02 LAB — CBC WITH DIFFERENTIAL/PLATELET
Abs Immature Granulocytes: 0.01 10*3/uL (ref 0.00–0.07)
Basophils Absolute: 0 10*3/uL (ref 0.0–0.1)
Basophils Relative: 0 %
Eosinophils Absolute: 0.1 10*3/uL (ref 0.0–0.5)
Eosinophils Relative: 3 %
HCT: 35.4 % — ABNORMAL LOW (ref 39.0–52.0)
Hemoglobin: 11.3 g/dL — ABNORMAL LOW (ref 13.0–17.0)
Immature Granulocytes: 0 %
Lymphocytes Relative: 28 %
Lymphs Abs: 1.2 10*3/uL (ref 0.7–4.0)
MCH: 28.8 pg (ref 26.0–34.0)
MCHC: 31.9 g/dL (ref 30.0–36.0)
MCV: 90.1 fL (ref 80.0–100.0)
Monocytes Absolute: 0.2 10*3/uL (ref 0.1–1.0)
Monocytes Relative: 5 %
Neutro Abs: 2.7 10*3/uL (ref 1.7–7.7)
Neutrophils Relative %: 64 %
Platelets: 428 10*3/uL — ABNORMAL HIGH (ref 150–400)
RBC: 3.93 MIL/uL — ABNORMAL LOW (ref 4.22–5.81)
RDW: 12.6 % (ref 11.5–15.5)
WBC: 4.2 10*3/uL (ref 4.0–10.5)
nRBC: 0 % (ref 0.0–0.2)

## 2023-03-02 LAB — IRON AND TIBC
Iron: 45 ug/dL (ref 45–182)
Saturation Ratios: 11 % — ABNORMAL LOW (ref 17.9–39.5)
TIBC: 398 ug/dL (ref 250–450)
UIBC: 353 ug/dL

## 2023-03-02 LAB — FERRITIN: Ferritin: 28 ng/mL (ref 24–336)

## 2023-03-02 MED ORDER — SODIUM CHLORIDE 0.9 % IV SOLN
Freq: Once | INTRAVENOUS | Status: AC
  Filled 2023-03-02: qty 250

## 2023-03-02 MED ORDER — SODIUM CHLORIDE 0.9 % IV SOLN
200.0000 mg | Freq: Once | INTRAVENOUS | Status: AC
  Administered 2023-03-02: 200 mg via INTRAVENOUS
  Filled 2023-03-02: qty 200

## 2023-03-02 NOTE — Progress Notes (Signed)
Hematology/Oncology Progress note [phone] [phone]            Patient Care Team: Myrene Buddy, NP as PCP - General (Internal Medicine)  ASSESSMENT & PLAN:   IDA (iron deficiency anemia) Labs are reviewed and discussed with patient. Lab Results  Component Value Date   IRON 45 03/02/2023   TIBC 398 03/02/2023   IRONPCTSAT 11 (L) 03/02/2023   FERRITIN 28 03/02/2023   HGB 11.3 (L) 03/02/2023    Recommend IV Venofer weekly x 2   Thrombocytosis Due to iron deficiency anemia.  Monitor.   Hemorrhoids He is interested in hemorrhoid treatment and surgery. Recommend him to follow up with GI  Follow up in 6 months- lab prior to MD + venofer. Iron labs retic panel.   No orders of the defined types were placed in this encounter.   All questions were answered. The patient knows to call the clinic with any problems, questions or concerns.  Randy Patience, MD, PhD [facility] Hematology Oncology 03/02/2023   CHIEF COMPLAINTS/REASON FOR VISIT:  Follow-up for iron deficiency anemia  HISTORY OF PRESENTING ILLNESS:   Randy Sawyer is a  62 y.o.  male presents for follow up of anemia  Patient reports that he was previously evaluated by hematologist years ago and was told everything was fine. Patient has a chronically decreased hemoglobin, dated back to at least 2015.  His baseline is around 11 02/20/2022, hemoglobin has decreased to 9.9, MCV 89.2.  Patient was referred to establish care with hematology for further evaluation. Patient reports chronic history of intermittent blood in the stool.  He reports a colonoscopy in 6 to 7 years, not available in current EMR. Denies any unintentional weight loss, fever, night sweats. He is also over-the-counter supplements including alpha king supreme, -testpsteron booster, yohimbe 451 ultra saw palmetto .  Since his last blood work, he has stopped testosterone. Patient drinks occasionally.   He had a  colonoscopy and upper endoscopy performed 09/01/13: Colonoscopy showed small internal hemorrhoids, normal prostate. Exam otherwise normal. The upper endoscopy showed a normal esophagus, non bleeding erosive gastropathy described as two dispersed small non bleeding erosions found in the gastric antrum. There is no stigmata of recent bleeding. There is normal duodenum. Pathology returned chronic gastritis with intestinal metaplasia and atrophy. Negative for Helicobacter pylori, negative for dysplasia.   Repeat colonoscopy requested in 10 years, no repeat EGD requested. Patient was found to have H pylori IGG positive testing before the luminal evaluation and he was treated with Prev pac. Patient had a negative post H pylori treatment stool antigen.    .INTERVAL HISTORY Randy Sawyer is a 62 y.o. male who has above history reviewed by me today presents for follow up visit for management of iron deficiency anemia Patient reports feeling well.  Flare up of hemorrhoid, blood in stool.  No other new complaints.     Review of Systems  Constitutional:  Negative for appetite change, chills, fatigue, fever and unexpected weight change.  HENT:   Negative for hearing loss and voice change.   Eyes:  Negative for eye problems and icterus.  Respiratory:  Negative for chest tightness, cough and shortness of breath.   Cardiovascular:  Negative for chest pain and leg swelling.  Gastrointestinal:  Positive for blood in stool. Negative for abdominal distention and abdominal pain.  Endocrine: Negative for hot flashes.  Genitourinary:  Negative for difficulty urinating, dysuria and frequency.   Musculoskeletal:  Negative for arthralgias.  Skin:  Negative for itching and rash.  Neurological:  Negative for light-headedness and numbness.  Hematological:  Negative for adenopathy. Does not bruise/bleed easily.  Psychiatric/Behavioral:  Negative for confusion.     MEDICAL HISTORY:  Past Medical History:   Diagnosis Date   Allergy    Hypertension     SURGICAL HISTORY: Past Surgical History:  Procedure Laterality Date   COLONOSCOPY WITH PROPOFOL N/A 11/14/2022   Procedure: COLONOSCOPY WITH PROPOFOL;  Surgeon: Midge Minium, MD;  Location: [facility] ENDOSCOPY;  Service: Endoscopy;  Laterality: N/A;   ESOPHAGOGASTRODUODENOSCOPY N/A 11/14/2022   Procedure: ESOPHAGOGASTRODUODENOSCOPY (EGD);  Surgeon: Midge Minium, MD;  Location: [facility] ENDOSCOPY;  Service: Endoscopy;  Laterality: N/A;   KNEE SURGERY      SOCIAL HISTORY: Social History   Socioeconomic History   Marital status: Single    Spouse name: Not on file   Number of children: Not on file   Years of education: Not on file   Highest education level: Not on file  Occupational History   Not on file  Tobacco Use   Smoking status: Former    Types: Cigarettes    Quit date: 54    Years since quitting: 26.3   Smokeless tobacco: Never  Vaping Use   Vaping Use: Never used  Substance and Sexual Activity   Alcohol use: Yes    Alcohol/week: 1.0 standard drink of alcohol    Types: 1 Glasses of wine per week    Comment: occationally.   Drug use: Never   Sexual activity: Yes  Other Topics Concern   Not on file  Social History Narrative   Not on file   Social Determinants of Health   Financial Resource Strain: Not on file  Food Insecurity: Not on file  Transportation Needs: Not on file  Physical Activity: Not on file  Stress: Not on file  Social Connections: Not on file  Intimate Partner Violence: Not on file    FAMILY HISTORY: Family History  Problem Relation Age of Onset   Hypertension Mother    Kidney failure Mother    Diabetes Mother    Prostate cancer Father    Diabetes Paternal Grandmother     ALLERGIES:  has No Known Allergies.  MEDICATIONS:  Current Outpatient Medications  Medication Sig Dispense Refill   amitriptyline (ELAVIL) 10 MG tablet Take by mouth.     losartan (COZAAR) 25 MG tablet Take 25 mg by mouth  daily.     Multiple Vitamin (MULTIVITAMIN) capsule Take 1 capsule by mouth daily.     NON FORMULARY Ultra Saw Palmetto formula     pantoprazole (PROTONIX) 20 MG tablet Take 20 mg by mouth daily.     triamcinolone (NASACORT) 55 MCG/ACT AERO nasal inhaler Place into the nose.     triamcinolone cream (KENALOG) 0.1 % Apply topically daily.     ascorbic acid (VITAMIN C) 1000 MG tablet Take by mouth. (Patient not taking: Reported on 03/02/2023)     docusate sodium (COLACE) 100 MG capsule Take 1 capsule (100 mg total) by mouth daily as needed for mild constipation. (Patient not taking: Reported on 03/02/2023) 30 capsule 2   ferrous sulfate 325 (65 FE) MG EC tablet Take 1 tablet (325 mg total) by mouth 3 (three) times daily with meals. (Patient not taking: Reported on 03/02/2023) 60 tablet 2   NON FORMULARY Alpha king supreme (Patient not taking: Reported on 03/02/2023)     rosuvastatin (CRESTOR) 5 MG tablet Take 5 mg by mouth daily. (Patient not taking:  Reported on 03/02/2023)     No current facility-administered medications for this visit.     PHYSICAL EXAMINATION: Vitals:   03/02/23 1209  Temp: (!) 97.2 F (36.2 C)   There were no vitals filed for this visit.   Physical Exam Constitutional:      General: He is not in acute distress.    Appearance: He is obese.  HENT:     Head: Normocephalic and atraumatic.  Eyes:     General: No scleral icterus. Cardiovascular:     Rate and Rhythm: Normal rate and regular rhythm.     Heart sounds: Normal heart sounds.  Pulmonary:     Effort: Pulmonary effort is normal. No respiratory distress.     Breath sounds: No wheezing.  Abdominal:     General: Bowel sounds are normal. There is no distension.     Palpations: Abdomen is soft.  Musculoskeletal:        General: No deformity. Normal range of motion.     Cervical back: Normal range of motion and neck supple.  Skin:    General: Skin is warm and dry.     Findings: No erythema or rash.   Neurological:     Mental Status: He is alert and oriented to person, place, and time. Mental status is at baseline.     Cranial Nerves: No cranial nerve deficit.     Coordination: Coordination normal.  Psychiatric:        Mood and Affect: Mood normal.     LABORATORY DATA:  I have reviewed the data as listed Lab Results  Component Value Date   WBC 4.2 03/02/2023   HGB 11.3 (L) 03/02/2023   HCT 35.4 (L) 03/02/2023   MCV 90.1 03/02/2023   PLT 428 (H) 03/02/2023   No results for input(s): "NA", "K", "CL", "CO2", "GLUCOSE", "BUN", "CREATININE", "CALCIUM", "GFRNONAA", "GFRAA", "PROT", "ALBUMIN", "AST", "ALT", "ALKPHOS", "BILITOT", "BILIDIR", "IBILI" in the last 8760 hours. Iron/TIBC/Ferritin/ %Sat    Component Value Date/Time   IRON 45 03/02/2023 0815   TIBC 398 03/02/2023 0815   FERRITIN 28 03/02/2023 0815   IRONPCTSAT 11 (L) 03/02/2023 0815      RADIOGRAPHIC STUDIES: I have personally reviewed the radiological images as listed and agreed with the findings in the report. No results found.

## 2023-03-02 NOTE — Assessment & Plan Note (Signed)
He is interested in hemorrhoid treatment and surgery. Recommend him to follow up with GI

## 2023-03-02 NOTE — Assessment & Plan Note (Addendum)
Labs are reviewed and discussed with patient. Lab Results  Component Value Date   IRON 45 03/02/2023   TIBC 398 03/02/2023   IRONPCTSAT 11 (L) 03/02/2023   FERRITIN 28 03/02/2023   HGB 11.3 (L) 03/02/2023    Recommend IV Venofer weekly x 2

## 2023-03-02 NOTE — Patient Instructions (Signed)

## 2023-03-02 NOTE — Assessment & Plan Note (Signed)
Due to iron deficiency anemia.  Monitor.

## 2023-03-09 ENCOUNTER — Inpatient Hospital Stay: Payer: 59

## 2023-03-09 VITALS — BP 146/75 | HR 52 | Temp 98.3°F | Resp 16

## 2023-03-09 DIAGNOSIS — D508 Other iron deficiency anemias: Secondary | ICD-10-CM

## 2023-03-09 DIAGNOSIS — D509 Iron deficiency anemia, unspecified: Secondary | ICD-10-CM | POA: Diagnosis not present

## 2023-03-09 MED ORDER — SODIUM CHLORIDE 0.9 % IV SOLN
200.0000 mg | Freq: Once | INTRAVENOUS | Status: AC
  Administered 2023-03-09: 200 mg via INTRAVENOUS
  Filled 2023-03-09: qty 200

## 2023-03-09 NOTE — Patient Instructions (Signed)

## 2023-06-26 ENCOUNTER — Ambulatory Visit: Payer: 59 | Admitting: Family Medicine

## 2023-07-25 ENCOUNTER — Encounter: Payer: Self-pay | Admitting: Family Medicine

## 2023-07-25 ENCOUNTER — Ambulatory Visit: Payer: 59 | Admitting: Family Medicine

## 2023-07-25 ENCOUNTER — Telehealth: Payer: Self-pay

## 2023-07-25 VITALS — BP 138/72 | HR 68 | Temp 97.7°F | Resp 16 | Ht 70.0 in | Wt 221.0 lb

## 2023-07-25 DIAGNOSIS — E559 Vitamin D deficiency, unspecified: Secondary | ICD-10-CM | POA: Diagnosis not present

## 2023-07-25 DIAGNOSIS — K219 Gastro-esophageal reflux disease without esophagitis: Secondary | ICD-10-CM

## 2023-07-25 DIAGNOSIS — E785 Hyperlipidemia, unspecified: Secondary | ICD-10-CM | POA: Diagnosis not present

## 2023-07-25 DIAGNOSIS — D649 Anemia, unspecified: Secondary | ICD-10-CM

## 2023-07-25 DIAGNOSIS — F39 Unspecified mood [affective] disorder: Secondary | ICD-10-CM

## 2023-07-25 DIAGNOSIS — Z125 Encounter for screening for malignant neoplasm of prostate: Secondary | ICD-10-CM | POA: Diagnosis not present

## 2023-07-25 DIAGNOSIS — Z1159 Encounter for screening for other viral diseases: Secondary | ICD-10-CM

## 2023-07-25 DIAGNOSIS — I10 Essential (primary) hypertension: Secondary | ICD-10-CM

## 2023-07-25 DIAGNOSIS — E538 Deficiency of other specified B group vitamins: Secondary | ICD-10-CM | POA: Diagnosis not present

## 2023-07-25 DIAGNOSIS — R7309 Other abnormal glucose: Secondary | ICD-10-CM

## 2023-07-25 DIAGNOSIS — D500 Iron deficiency anemia secondary to blood loss (chronic): Secondary | ICD-10-CM

## 2023-07-25 MED ORDER — PANTOPRAZOLE SODIUM 20 MG PO TBEC
20.0000 mg | DELAYED_RELEASE_TABLET | Freq: Every day | ORAL | 1 refills | Status: DC
Start: 2023-07-25 — End: 2023-08-24

## 2023-07-25 MED ORDER — LOSARTAN POTASSIUM 25 MG PO TABS
25.0000 mg | ORAL_TABLET | Freq: Every day | ORAL | 1 refills | Status: DC
Start: 2023-07-25 — End: 2023-08-06

## 2023-07-25 NOTE — Telephone Encounter (Signed)
Order added to lab

## 2023-07-25 NOTE — Telephone Encounter (Signed)
Patient states at check-out that he has decided that he would like to have his Hep C checked.  Patient states he originally told Dr. Dana Allan he did not want to have it checked, but he has changed his mind.  I let him know that I will send a message to Dr. Clent Ridges so she will know.

## 2023-07-25 NOTE — Progress Notes (Signed)
SUBJECTIVE:   Chief Complaint  Patient presents with   Establish Care   HPI Patient presents to clinic to establish care  Discussed the use of AI scribe software for clinical note transcription with the patient, who gave verbal consent to proceed.  History of Present Illness The patient, a Comptroller, presents to establish care and discuss concerns about hypertension and gastroesophageal reflux disease (GERD). The patient has been on losartan for hypertension for approximately 6-8 months but admits to inconsistent adherence, often taking it every other day. The patient reports home blood pressure readings of around 139/80.  Regarding GERD, the patient has been on pantoprazole (generic for Protonix) 40mg  every other day, but feels that symptoms were better controlled on a daily regimen of 20mg . The patient notes that symptoms are particularly exacerbated by late-night eating.  The patient also mentions a history of iron deficiency, for which he received an infusion approximately six months ago. The patient was previously on ferrous sulfate but has since discontinued it. The patient also has a history of elevated cholesterol, for which he was briefly on rosuvastatin (Crestor) approximately 10 years ago.  The patient also reports occasional use of amitriptyline for stress management, taking it as needed, typically at night. The patient denies any current psychiatric care.  The patient has a history of small ear canals and uses a prescribed solution to dissolve wax build-up. The patient also mentions a history of hemorrhoids, which occasionally bleed, particularly during episodes of constipation.  The patient leads an active lifestyle and believes in managing health through lifestyle modifications where possible. The patient has a son and two grandchildren, who he enjoys spending time with. The patient is due for a flu vaccine, which he plans to receive at a local pharmacy.     PERTINENT PMH / PSH: As above  OBJECTIVE:  BP 138/72   Pulse 68   Temp 97.7 F (36.5 C)   Resp 16   Ht 5\' 10"  (1.778 m)   Wt 221 lb (100.2 kg)   SpO2 98%   BMI 31.71 kg/m    Physical Exam Vitals reviewed.  Constitutional:      Appearance: He is obese.  HENT:     Head: Normocephalic.     Right Ear: Tympanic membrane, ear canal and external ear normal.     Left Ear: Tympanic membrane, ear canal and external ear normal.     Nose: Nose normal.     Mouth/Throat:     Mouth: Mucous membranes are moist.  Eyes:     Conjunctiva/sclera: Conjunctivae normal.     Pupils: Pupils are equal, round, and reactive to light.  Neck:     Thyroid: No thyromegaly or thyroid tenderness.     Vascular: No carotid bruit.  Cardiovascular:     Rate and Rhythm: Normal rate and regular rhythm.     Pulses: Normal pulses.     Heart sounds: Normal heart sounds.  Pulmonary:     Effort: Pulmonary effort is normal.     Breath sounds: Normal breath sounds.  Abdominal:     General: Abdomen is flat. Bowel sounds are normal.     Palpations: Abdomen is soft.  Musculoskeletal:        General: Normal range of motion.     Cervical back: Normal range of motion and neck supple.     Right lower leg: No edema.     Left lower leg: No edema.  Lymphadenopathy:     Cervical: No cervical  adenopathy.  Neurological:     Mental Status: He is alert.  Psychiatric:        Mood and Affect: Mood normal.        Behavior: Behavior normal.        Thought Content: Thought content normal.        Judgment: Judgment normal.        07/25/2023    9:10 AM  Depression screen PHQ 2/9  Decreased Interest 0  Down, Depressed, Hopeless 0  PHQ - 2 Score 0  Altered sleeping 0  Tired, decreased energy 1  Change in appetite 0  Feeling bad or failure about yourself  0  Trouble concentrating 0  Moving slowly or fidgety/restless 0  Suicidal thoughts 0  PHQ-9 Score 1  Difficult doing work/chores Not difficult at all       07/25/2023    9:10 AM  GAD 7 : Generalized Anxiety Score  Nervous, Anxious, on Edge 0  Control/stop worrying 0  Worry too much - different things 0  Trouble relaxing 0  Restless 0  Easily annoyed or irritable 1  Afraid - awful might happen 0  Total GAD 7 Score 1    ASSESSMENT/PLAN:  Vitamin D deficiency -     VITAMIN D 25 Hydroxy (Vit-D Deficiency, Fractures); Future  Vitamin B 12 deficiency -     Vitamin B12; Future  Prostate cancer screening -     PSA; Future  Hyperlipidemia, unspecified hyperlipidemia type Assessment & Plan: Check fasting lipids  Orders: -     Lipid panel; Future  Abnormal glucose -     Hemoglobin A1c; Future  Primary hypertension Assessment & Plan: Blood pressure readings at home are elevated (around 139/80). Patient is currently on Losartan but not taking it consistently. Discussed the importance of regular medication intake and the potential need for dose adjustment or addition of another antihypertensive. Also discussed the potential switch to Amlodipine.  -Refill Losartan and take consistently. -Check kidney function. -Consider switching to Amlodipine after reviewing blood work and further discussion at annual visit.  Orders: -     Comprehensive metabolic panel; Future -     Losartan Potassium; Take 1 tablet (25 mg total) by mouth daily.  Dispense: 30 tablet; Refill: 1  Chronic anemia -     Pantoprazole Sodium; Take 1 tablet (20 mg total) by mouth daily.  Dispense: 30 tablet; Refill: 1  Need for hepatitis C screening test -     Hepatitis C antibody; Future  Gastroesophageal reflux disease, unspecified whether esophagitis present Assessment & Plan: Currently on Pantoprazole 40mg  every other day, but patient reports increased symptoms, particularly when eating late at night. Previously had better control on daily Protonix. -Refill Pantoprazole to 20mg  daily for next refill.   Iron deficiency anemia due to chronic blood loss Assessment &  Plan: History of iron infusions due to low iron levels. Last infusion approximately 6 months ago. No current follow-up with a hematologist. -Check iron levels in upcoming blood work.   Mood disorder (HCC) Assessment & Plan: Managed with Amytriptyline 5-10 mg at night as needed -Continue current management     General Health Maintenance -Schedule annual physical exam. -Complete fasting blood work prior to annual physical. -Obtain flu vaccine at [facility] on 07/27/2023 and provide a copy of the record at next visit. -Consider HIV screening. Patient to decide. -Hepatitis C screening with labs -Check PSA level.  -Colonoscopy up to date.     PDMP reviewed  Return for PCP.  Dana Allan, MD

## 2023-07-25 NOTE — Patient Instructions (Addendum)
It was a pleasure meeting you today. Thank you for allowing me to take part in your health care.  Our goals for today as we discussed include:  Consider switching to Amlodipine. If not wanting to switch, ensure taking Losartan 25 mg daily. Check blood pressure at home  Goal <140/90  Please schedule fasting lab appointment. Fast for 10 hours  Please bring in copy of Flu vaccine at next visit  This is a list of the screening recommended for you and due dates:  Health Maintenance  Topic Date Due   HIV Screening  Never done   Hepatitis C Screening  Never done   COVID-19 Vaccine (4 - 2023-24 season) 06/24/2023   Flu Shot  01/21/2024*   DTaP/Tdap/Td vaccine (2 - Td or Tdap) 11/09/2025   Colon Cancer Screening  11/14/2032   Zoster (Shingles) Vaccine  Completed   HPV Vaccine  Aged Out  *Topic was postponed. The date shown is not the original due date.      Schedule annual physical 1-2 weeks  If you have any questions or concerns, please do not hesitate to call the office at [phone].  I look forward to our next visit and until then take care and stay safe.  Regards,   Dana Allan, MD   [facility]

## 2023-08-01 ENCOUNTER — Encounter: Payer: Self-pay | Admitting: Family Medicine

## 2023-08-01 ENCOUNTER — Other Ambulatory Visit (INDEPENDENT_AMBULATORY_CARE_PROVIDER_SITE_OTHER): Payer: 59

## 2023-08-01 DIAGNOSIS — Z125 Encounter for screening for malignant neoplasm of prostate: Secondary | ICD-10-CM | POA: Diagnosis not present

## 2023-08-01 DIAGNOSIS — F39 Unspecified mood [affective] disorder: Secondary | ICD-10-CM

## 2023-08-01 DIAGNOSIS — I10 Essential (primary) hypertension: Secondary | ICD-10-CM | POA: Insufficient documentation

## 2023-08-01 DIAGNOSIS — E785 Hyperlipidemia, unspecified: Secondary | ICD-10-CM | POA: Diagnosis not present

## 2023-08-01 DIAGNOSIS — E559 Vitamin D deficiency, unspecified: Secondary | ICD-10-CM

## 2023-08-01 DIAGNOSIS — Z1159 Encounter for screening for other viral diseases: Secondary | ICD-10-CM | POA: Diagnosis not present

## 2023-08-01 DIAGNOSIS — R7309 Other abnormal glucose: Secondary | ICD-10-CM | POA: Diagnosis not present

## 2023-08-01 DIAGNOSIS — E538 Deficiency of other specified B group vitamins: Secondary | ICD-10-CM

## 2023-08-01 HISTORY — DX: Unspecified mood (affective) disorder: F39

## 2023-08-01 LAB — LIPID PANEL
Cholesterol: 182 mg/dL (ref 0–200)
HDL: 37.7 mg/dL — ABNORMAL LOW (ref >39.00)
LDL Cholesterol: 115 mg/dL — ABNORMAL HIGH (ref 0–99)
NonHDL: 144.13
Total CHOL/HDL Ratio: 5
Triglycerides: 147 mg/dL (ref 0.0–149.0)
VLDL: 29.4 mg/dL (ref 0.0–40.0)

## 2023-08-01 LAB — VITAMIN B12: Vitamin B-12: 517 pg/mL (ref 211–911)

## 2023-08-01 LAB — COMPREHENSIVE METABOLIC PANEL
ALT: 36 U/L (ref 0–53)
AST: 30 U/L (ref 0–37)
Albumin: 4.4 g/dL (ref 3.5–5.2)
Alkaline Phosphatase: 72 U/L (ref 39–117)
BUN: 9 mg/dL (ref 6–23)
CO2: 31 meq/L (ref 19–32)
Calcium: 9.8 mg/dL (ref 8.4–10.5)
Chloride: 103 meq/L (ref 96–112)
Creatinine, Ser: 1.19 mg/dL (ref 0.40–1.50)
GFR: 65.87 mL/min (ref >60.00)
Glucose, Bld: 108 mg/dL — ABNORMAL HIGH (ref 70–99)
Potassium: 4 meq/L (ref 3.5–5.1)
Sodium: 141 meq/L (ref 135–145)
Total Bilirubin: 0.4 mg/dL (ref 0.2–1.2)
Total Protein: 7.1 g/dL (ref 6.0–8.3)

## 2023-08-01 LAB — VITAMIN D 25 HYDROXY (VIT D DEFICIENCY, FRACTURES): VITD: 25.45 ng/mL — ABNORMAL LOW (ref 30.00–100.00)

## 2023-08-01 LAB — PSA: PSA: 8.42 ng/mL — ABNORMAL HIGH (ref 0.10–4.00)

## 2023-08-01 LAB — HEMOGLOBIN A1C: Hgb A1c MFr Bld: 5.1 % (ref 4.6–6.5)

## 2023-08-01 NOTE — Assessment & Plan Note (Signed)
History of iron infusions due to low iron levels. Last infusion approximately 6 months ago. No current follow-up with a hematologist. -Check iron levels in upcoming blood work.

## 2023-08-01 NOTE — Assessment & Plan Note (Deleted)
History of iron infusions due to low iron levels. Last infusion approximately 6 months ago. No current follow-up with a hematologist. -Check iron levels in upcoming blood work.

## 2023-08-01 NOTE — Assessment & Plan Note (Signed)
Check fasting lipids 

## 2023-08-01 NOTE — Assessment & Plan Note (Addendum)
Managed with Amytriptyline 5-10 mg at night as needed -Continue current management

## 2023-08-01 NOTE — Assessment & Plan Note (Addendum)
Blood pressure readings at home are elevated (around 139/80). Patient is currently on Losartan but not taking it consistently. Discussed the importance of regular medication intake and the potential need for dose adjustment or addition of another antihypertensive. Also discussed the potential switch to Amlodipine.  -Refill Losartan and take consistently. -Check kidney function. -Consider switching to Amlodipine after reviewing blood work and further discussion at annual visit.

## 2023-08-01 NOTE — Assessment & Plan Note (Addendum)
Currently on Pantoprazole 40mg  every other day, but patient reports increased symptoms, particularly when eating late at night. Previously had better control on daily Protonix. -Refill Pantoprazole to 20mg  daily for next refill.

## 2023-08-02 ENCOUNTER — Other Ambulatory Visit: Payer: Self-pay | Admitting: Family Medicine

## 2023-08-02 DIAGNOSIS — E559 Vitamin D deficiency, unspecified: Secondary | ICD-10-CM

## 2023-08-02 DIAGNOSIS — E785 Hyperlipidemia, unspecified: Secondary | ICD-10-CM

## 2023-08-02 LAB — HEPATITIS C ANTIBODY: Hepatitis C Ab: NONREACTIVE

## 2023-08-02 MED ORDER — VITAMIN D (ERGOCALCIFEROL) 1.25 MG (50000 UNIT) PO CAPS
50000.0000 [IU] | ORAL_CAPSULE | ORAL | 1 refills | Status: DC
Start: 2023-08-02 — End: 2024-01-30

## 2023-08-02 MED ORDER — ROSUVASTATIN CALCIUM 20 MG PO TABS
20.0000 mg | ORAL_TABLET | Freq: Every day | ORAL | 3 refills | Status: DC
Start: 2023-08-02 — End: 2024-02-01

## 2023-08-06 ENCOUNTER — Encounter: Payer: Self-pay | Admitting: Family Medicine

## 2023-08-06 ENCOUNTER — Ambulatory Visit: Payer: 59 | Admitting: Family Medicine

## 2023-08-06 VITALS — BP 132/78 | HR 61 | Temp 98.0°F | Resp 16 | Ht 70.0 in | Wt 219.4 lb

## 2023-08-06 DIAGNOSIS — R972 Elevated prostate specific antigen [PSA]: Secondary | ICD-10-CM | POA: Diagnosis not present

## 2023-08-06 DIAGNOSIS — E559 Vitamin D deficiency, unspecified: Secondary | ICD-10-CM

## 2023-08-06 DIAGNOSIS — E785 Hyperlipidemia, unspecified: Secondary | ICD-10-CM | POA: Diagnosis not present

## 2023-08-06 DIAGNOSIS — Z0000 Encounter for general adult medical examination without abnormal findings: Secondary | ICD-10-CM | POA: Insufficient documentation

## 2023-08-06 DIAGNOSIS — I10 Essential (primary) hypertension: Secondary | ICD-10-CM

## 2023-08-06 MED ORDER — AMLODIPINE BESYLATE 5 MG PO TABS
2.5000 mg | ORAL_TABLET | Freq: Every evening | ORAL | 0 refills | Status: DC
Start: 2023-08-06 — End: 2023-10-01

## 2023-08-06 NOTE — Assessment & Plan Note (Signed)
Low vitamin D levels. Patient is currently taking prescribed vitamin D. -Continue Vitamin D supplementation.

## 2023-08-06 NOTE — Assessment & Plan Note (Signed)
Elevated cholesterol levels. Discussed the benefits and potential side effects of starting Rosuvastatin (Crestor). -Start Rosuvastatin 20mg  daily. -Repeat cholesterol levels in 30 days, then stop medication for 30 days and repeat levels.

## 2023-08-06 NOTE — Patient Instructions (Addendum)
It was a pleasure meeting you today. Thank you for allowing me to take part in your health care.  Our goals for today as we discussed include:  Continue current medication for blood pressure  PSA elevated.  Repeat future and checking urine.   Please let me know who you want to see at Urology.  If needing referral I'm happy to send  Stop Losartan Monitor blood pressure. Goal <150/90.  If blood pressure > than this start Amlodipine 5 mg at night. You will take half a tablet.  Schedule nurse appointment in 1 week for BP check.  Bring in your blood pressure monitor  This is a list of the screening recommended for you and due dates:  Health Maintenance  Topic Date Due   HIV Screening  Never done   COVID-19 Vaccine (4 - 2023-24 season) 06/24/2023   Flu Shot  01/21/2024*   DTaP/Tdap/Td vaccine (2 - Td or Tdap) 11/09/2025   Colon Cancer Screening  11/14/2032   Hepatitis C Screening  Completed   Zoster (Shingles) Vaccine  Completed   HPV Vaccine  Aged Out  *Topic was postponed. The date shown is not the original due date.     If you have any questions or concerns, please do not hesitate to call the office at [phone].  I look forward to our next visit and until then take care and stay safe.  Regards,   Dana Allan, MD   [facility]

## 2023-08-06 NOTE — Progress Notes (Signed)
SUBJECTIVE:   Chief Complaint  Patient presents with   Annual Exam   HPI Presents for annual physical  Discussed the use of AI scribe software for clinical note transcription with the patient, who gave verbal consent to proceed.  History of Present Illness   The patient, previously under the care of Dr. Daleen Squibb, has a history of elevated PSA levels and iron deficiency. He reports a family history of prostate cancer, with both his father and grandfather having been diagnosed. The patient's father was diagnosed at the age of 8. The patient's PSA levels have been fluctuating, with the most recent reading being the highest he has ever been. He has not followed up with a urologist recently due to concerns about his iron levels.  The patient has also been dealing with blood pressure issues, with readings occasionally spiking to 150/82, particularly during periods of stress. He has been taking losartan, but has not taken it consistently. He reports no issues with urination, but has occasionally felt that something was not quite right, although this is not a regular occurrence.  In addition to these concerns, the patient has been dealing with low vitamin D levels. He has been taking a supplement for this, but reports that he does not take it regularly. He has also been prescribed Crestor for cholesterol management, but has not started taking it due to issues with his insurance.  The patient has previously undergone iron infusions for his iron deficiency, with the most recent infusion occurring approximately six to seven months ago. He reports that his iron levels have improved since then. He has also undergone a colonoscopy and endoscopy, with no significant findings reported.  The patient has expressed a desire to manage his health issues with as little medication as possible, and is open to trying different approaches to manage his blood pressure and cholesterol levels.    PERTINENT PMH / PSH: As  above  OBJECTIVE:  BP 132/78   Pulse 61   Temp 98 F (36.7 C)   Resp 16   Ht 5\' 10"  (1.778 m)   Wt 219 lb 6 oz (99.5 kg)   SpO2 98%   BMI 31.48 kg/m    Physical Exam Vitals reviewed.  Constitutional:      General: He is not in acute distress.    Appearance: Normal appearance. He is obese. He is not ill-appearing, toxic-appearing or diaphoretic.  Eyes:     General:        Right eye: No discharge.        Left eye: No discharge.  Cardiovascular:     Rate and Rhythm: Normal rate and regular rhythm.     Heart sounds: Normal heart sounds.  Pulmonary:     Effort: Pulmonary effort is normal.     Breath sounds: Normal breath sounds.  Abdominal:     General: Bowel sounds are normal.  Musculoskeletal:        General: Normal range of motion.     Cervical back: Normal range of motion.  Skin:    General: Skin is warm and dry.  Neurological:     Mental Status: He is alert and oriented to person, place, and time. Mental status is at baseline.  Psychiatric:        Mood and Affect: Mood normal.        Behavior: Behavior normal.        Thought Content: Thought content normal.        Judgment: Judgment normal.  08/06/2023   11:32 AM 07/25/2023    9:10 AM  Depression screen PHQ 2/9  Decreased Interest 0 0  Down, Depressed, Hopeless 0 0  PHQ - 2 Score 0 0  Altered sleeping 0 0  Tired, decreased energy 1 1  Change in appetite 0 0  Feeling bad or failure about yourself  0 0  Trouble concentrating 0 0  Moving slowly or fidgety/restless 0 0  Suicidal thoughts 0 0  PHQ-9 Score 1 1  Difficult doing work/chores Not difficult at all Not difficult at all      08/06/2023   11:32 AM 07/25/2023    9:10 AM  GAD 7 : Generalized Anxiety Score  Nervous, Anxious, on Edge 0 0  Control/stop worrying 0 0  Worry too much - different things 0 0  Trouble relaxing 0 0  Restless 0 0  Easily annoyed or irritable 0 1  Afraid - awful might happen 0 0  Total GAD 7 Score 0 1     ASSESSMENT/PLAN:  Annual physical exam Assessment & Plan: Colonoscopy/Endoscopy Patient reports having both procedures done in January. Hep C completed HIV screening declined PHQ9/GAD screening Reviewed recent annual labs PSA elevated.  Repeat today.   Flu vaccine declined  Tetanus up to date Shingles completed   Elevated PSA Assessment & Plan: Noted increase in PSA levels. Discussed the need for repeat testing and potential referral to urology. No urinary symptoms reported. -Repeat PSA and urinalysis today. -Advise patient to schedule an appointment with urology.  Orders: -     PSA; Future -     Urinalysis, Routine w reflex microscopic; Future  Primary hypertension Assessment & Plan: Patient has been taking Losartan, but blood pressure is well-controlled off medication. Discussed switching to Amlodipine if needed. -Discontinue Losartan. -Monitor blood pressure at home. -Start Amlodipine 2.5mg  at night only if blood pressure is consistently elevated. -Follow up in nurse's clinic in 1 week to check blood pressure.  Orders: -     amLODIPine Besylate; Take 0.5 tablets (2.5 mg total) by mouth at bedtime.  Dispense: 30 tablet; Refill: 0  Hyperlipidemia, unspecified hyperlipidemia type Assessment & Plan: Elevated cholesterol levels. Discussed the benefits and potential side effects of starting Rosuvastatin (Crestor). -Start Rosuvastatin 20mg  daily. -Repeat cholesterol levels in 30 days, then stop medication for 30 days and repeat levels.   Vitamin D deficiency Assessment & Plan: Low vitamin D levels. Patient is currently taking prescribed vitamin D. -Continue Vitamin D supplementation.    PDMP reviewed  Return for RN clinic, blood pressure and check home monitor.  Dana Allan, MD

## 2023-08-06 NOTE — Assessment & Plan Note (Signed)
History of low iron levels and previous iron infusions. No recent infusions. Follows with Oncology.  Last infusion -Continue current management.

## 2023-08-06 NOTE — Assessment & Plan Note (Signed)
Patient has been taking Losartan, but blood pressure is well-controlled off medication. Discussed switching to Amlodipine if needed. -Discontinue Losartan. -Monitor blood pressure at home. -Start Amlodipine 2.5mg  at night only if blood pressure is consistently elevated. -Follow up in nurse's clinic in 1 week to check blood pressure.

## 2023-08-06 NOTE — Assessment & Plan Note (Signed)
Colonoscopy/Endoscopy Patient reports having both procedures done in January. Hep C completed HIV screening declined PHQ9/GAD screening Reviewed recent annual labs PSA elevated.  Repeat today.   Flu vaccine declined  Tetanus up to date Shingles completed

## 2023-08-06 NOTE — Assessment & Plan Note (Signed)
Noted increase in PSA levels. Discussed the need for repeat testing and potential referral to urology. No urinary symptoms reported. -Repeat PSA and urinalysis today. -Advise patient to schedule an appointment with urology.

## 2023-08-08 ENCOUNTER — Other Ambulatory Visit: Payer: Self-pay | Admitting: Family Medicine

## 2023-08-08 ENCOUNTER — Other Ambulatory Visit (INDEPENDENT_AMBULATORY_CARE_PROVIDER_SITE_OTHER): Payer: 59

## 2023-08-08 ENCOUNTER — Telehealth: Payer: Self-pay | Admitting: Family Medicine

## 2023-08-08 ENCOUNTER — Encounter: Payer: Self-pay | Admitting: Family Medicine

## 2023-08-08 DIAGNOSIS — E785 Hyperlipidemia, unspecified: Secondary | ICD-10-CM

## 2023-08-08 DIAGNOSIS — R972 Elevated prostate specific antigen [PSA]: Secondary | ICD-10-CM

## 2023-08-08 LAB — URINALYSIS, ROUTINE W REFLEX MICROSCOPIC
Bilirubin Urine: NEGATIVE
Hgb urine dipstick: NEGATIVE
Ketones, ur: NEGATIVE
Leukocytes,Ua: NEGATIVE
Nitrite: NEGATIVE
RBC / HPF: NONE SEEN (ref 0–?)
Specific Gravity, Urine: 1.025 (ref 1.000–1.030)
Total Protein, Urine: NEGATIVE
Urine Glucose: NEGATIVE
Urobilinogen, UA: 0.2 (ref 0.0–1.0)
WBC, UA: NONE SEEN (ref 0–?)
pH: 6 (ref 5.0–8.0)

## 2023-08-08 LAB — PSA: PSA: 7.88 ng/mL — ABNORMAL HIGH (ref 0.10–4.00)

## 2023-08-08 NOTE — Telephone Encounter (Signed)
Patient called and is needing an approval sent to pharmacy  for the amLODipine (NORVASC) 5 MG tablet . Pharmacy is needing it for the generic brand. Please call [facility] on [address].

## 2023-08-09 NOTE — Telephone Encounter (Signed)
Noted  

## 2023-08-10 ENCOUNTER — Other Ambulatory Visit (HOSPITAL_COMMUNITY): Payer: Self-pay

## 2023-08-10 ENCOUNTER — Encounter: Payer: Self-pay | Admitting: Oncology

## 2023-08-14 ENCOUNTER — Ambulatory Visit: Payer: 59

## 2023-08-16 ENCOUNTER — Other Ambulatory Visit (HOSPITAL_COMMUNITY): Payer: Self-pay

## 2023-08-24 ENCOUNTER — Other Ambulatory Visit: Payer: Self-pay | Admitting: Family Medicine

## 2023-08-24 DIAGNOSIS — D649 Anemia, unspecified: Secondary | ICD-10-CM

## 2023-08-31 ENCOUNTER — Other Ambulatory Visit (HOSPITAL_COMMUNITY): Payer: Self-pay

## 2023-09-07 ENCOUNTER — Inpatient Hospital Stay: Payer: 59

## 2023-09-07 ENCOUNTER — Inpatient Hospital Stay: Payer: 59 | Attending: Oncology | Admitting: Oncology

## 2023-09-07 ENCOUNTER — Encounter: Payer: Self-pay | Admitting: Oncology

## 2023-09-07 NOTE — Assessment & Plan Note (Deleted)
Labs are reviewed and discussed with patient. Lab Results  Component Value Date   IRON 45 03/02/2023   TIBC 398 03/02/2023   IRONPCTSAT 11 (L) 03/02/2023   FERRITIN 28 03/02/2023   HGB 11.3 (L) 03/02/2023    Recommend IV Venofer weekly x 2

## 2023-09-29 ENCOUNTER — Other Ambulatory Visit: Payer: Self-pay | Admitting: Family Medicine

## 2023-09-29 DIAGNOSIS — I10 Essential (primary) hypertension: Secondary | ICD-10-CM

## 2024-01-18 ENCOUNTER — Encounter: Payer: Self-pay | Admitting: Oncology

## 2024-01-21 ENCOUNTER — Encounter: Payer: Self-pay | Admitting: Oncology

## 2024-01-21 ENCOUNTER — Telehealth: Payer: Self-pay

## 2024-01-21 ENCOUNTER — Ambulatory Visit: Admitting: Family Medicine

## 2024-01-21 ENCOUNTER — Encounter: Payer: Self-pay | Admitting: Family Medicine

## 2024-01-21 VITALS — BP 136/78 | HR 73 | Temp 98.2°F | Resp 20 | Ht 70.0 in | Wt 227.0 lb

## 2024-01-21 DIAGNOSIS — E538 Deficiency of other specified B group vitamins: Secondary | ICD-10-CM

## 2024-01-21 DIAGNOSIS — K219 Gastro-esophageal reflux disease without esophagitis: Secondary | ICD-10-CM

## 2024-01-21 DIAGNOSIS — I10 Essential (primary) hypertension: Secondary | ICD-10-CM

## 2024-01-21 DIAGNOSIS — J309 Allergic rhinitis, unspecified: Secondary | ICD-10-CM | POA: Diagnosis not present

## 2024-01-21 DIAGNOSIS — E291 Testicular hypofunction: Secondary | ICD-10-CM | POA: Diagnosis not present

## 2024-01-21 DIAGNOSIS — R972 Elevated prostate specific antigen [PSA]: Secondary | ICD-10-CM

## 2024-01-21 DIAGNOSIS — D509 Iron deficiency anemia, unspecified: Secondary | ICD-10-CM

## 2024-01-21 DIAGNOSIS — D75839 Thrombocytosis, unspecified: Secondary | ICD-10-CM

## 2024-01-21 DIAGNOSIS — E785 Hyperlipidemia, unspecified: Secondary | ICD-10-CM

## 2024-01-21 DIAGNOSIS — J069 Acute upper respiratory infection, unspecified: Secondary | ICD-10-CM

## 2024-01-21 DIAGNOSIS — R7309 Other abnormal glucose: Secondary | ICD-10-CM

## 2024-01-21 DIAGNOSIS — E559 Vitamin D deficiency, unspecified: Secondary | ICD-10-CM

## 2024-01-21 DIAGNOSIS — Z1329 Encounter for screening for other suspected endocrine disorder: Secondary | ICD-10-CM

## 2024-01-21 MED ORDER — IPRATROPIUM BROMIDE 0.03 % NA SOLN: 2.0000 | Freq: Two times a day (BID) | NASAL | 12 refills | Status: AC

## 2024-01-21 MED ORDER — LEVOCETIRIZINE DIHYDROCHLORIDE 5 MG PO TABS: 5.0000 mg | ORAL_TABLET | Freq: Every evening | ORAL | 1 refills | Status: DC

## 2024-01-21 MED ORDER — AZELASTINE HCL 0.1 % NA SOLN
2.0000 | Freq: Two times a day (BID) | NASAL | 12 refills | Status: AC
Start: 2024-01-21 — End: ?

## 2024-01-21 NOTE — Telephone Encounter (Signed)
 Patient states at check-out that he would like to know if he is supposed to have his testosterone checked at his lab visit next week.  I let patient know that I do not see an order for testosterone in the system, so I will send a note to Dr. Clent Ridges to see what she thinks.

## 2024-01-21 NOTE — Patient Instructions (Signed)
 It was a pleasure meeting you today. Thank you for allowing me to take part in your health care.  Our goals for today as we discussed include:  Start astelin and ipratropium nasal sprays as directed Xyzal 5 mg at night  You can take Tylenol and/or Ibuprofen as needed for fever reduction and pain relief.   For cough: honey 1/2 to 1 teaspoon (you can dilute the honey in water or another fluid).  You can also use guaifenesin and dextromethorphan for cough. You can use a humidifier for chest congestion and cough.  If you don't have a humidifier, you can sit in the bathroom with the hot shower running.      For sore throat: try warm salt water gargles, cepacol lozenges, throat spray, warm tea or water with lemon/honey, popsicles or ice, or OTC cold relief medicine for throat discomfort.   For congestion: take a daily anti-histamine like Zyrtec, Claritin, and a oral decongestant, such as pseudoephedrine.  You can also use Flonase 1-2 sprays in each nostril daily.   It is important to stay hydrated: drink plenty of fluids (water, gatorade/powerade/pedialyte, juices, or teas) to keep your throat moisturized and help further relieve irritation/discomfort.    Schedule fasting blood work, appointment between 830-10 am for testosterone level    Randy Sawyer , Thank you for taking time to come for your Medicare Wellness Visit. I appreciate your ongoing commitment to your health goals. Please review the following plan we discussed and let me know if I can assist you in the future.   These are the goals we discussed:  Goals   None     This is a list of the screening recommended for you and due dates:  Health Maintenance  Topic Date Due   HIV Screening  Never done   COVID-19 Vaccine (4 - 2024-25 season) 06/24/2023   Flu Shot  01/21/2024*   DTaP/Tdap/Td vaccine (2 - Td or Tdap) 11/09/2025   Colon Cancer Screening  11/14/2032   Hepatitis C Screening  Completed   Zoster (Shingles) Vaccine   Completed   HPV Vaccine  Aged Out  *Topic was postponed. The date shown is not the original due date.     If you have any questions or concerns, please do not hesitate to call the office at [phone].  I look forward to our next visit and until then take care and stay safe.  Regards,   Dana Allan, MD   [facility]

## 2024-01-21 NOTE — Telephone Encounter (Signed)
 Patient requests we call and let him know whether or not we add the testosterone to his lab orders.

## 2024-01-21 NOTE — Progress Notes (Signed)
 SUBJECTIVE:   Chief Complaint  Patient presents with   Nasal Congestion    X 10 days   Fatigue   HPI Presents to clinic for acute visit  Discussed the use of AI scribe software for clinical note transcription with the patient, who gave verbal consent to proceed.  History of Present Illness Randy Sawyer is a 63 year old male who presents with persistent cough and congestion.  He has been experiencing a persistent cough and congestion for seven days. Initially, he had a scratchy throat, which progressed to a cough. He attributes the onset to washing his hair and going outside during fluctuating weather, combined with his severe allergies. He initially managed symptoms with Nyquil and a [facility] brand equivalent, which helped subside the cough, but the congestion remained thick and sticky. He then started taking Claritin and Nasacort, which he uses regularly during allergy season. This combination began to alleviate the symptoms, with the yellowish mucus clearing by Friday. He experienced a fever at the onset of symptoms, which resolved quickly with medication. The cough now only occurs when lying down and is more of a throat-clearing type. No current shortness of breath or wheezing, although he experienced wheezing last Wednesday, which resolved by Friday.  He has a history of severe allergies and has been using Nasacort for years, as recommended by a nose and throat specialist. He has tried various treatments, including allergy shots, but opted against them due to a 50% success rate. He has not experienced significant relief with Flonase in the past. He is currently taking Claritin during the day and Nasacort as needed. He stopped taking Nyquil last Wednesday as the cough subsided. He reports sleeping decently but not at his best.  He discusses his blood pressure management, noting that he is on a low dose of amlodipine but feels it may be affecting his energy levels. He previously took losartan  and is considering switching back due to side effects. His home blood pressure readings are around 138/80 mmHg.  He is actively involved in managing his mother's healthcare, which has been a significant source of stress. He frequently travels to the beach to manage his allergies and finds it beneficial. He is also involved in speaking engagements, which have been challenging due to his recent symptoms.    PERTINENT PMH / PSH: As above  OBJECTIVE:  BP 136/78   Pulse 73   Temp 98.2 F (36.8 C)   Resp 20   Ht 5\' 10"  (1.778 m)   Wt 227 lb (103 kg)   SpO2 98%   BMI 32.57 kg/m    Physical Exam Vitals reviewed.  Constitutional:      General: He is not in acute distress.    Appearance: Normal appearance. He is normal weight. He is not ill-appearing, toxic-appearing or diaphoretic.  HENT:     Right Ear: Tympanic membrane, ear canal and external ear normal.     Left Ear: Tympanic membrane, ear canal and external ear normal.  Eyes:     General:        Right eye: No discharge.        Left eye: No discharge.  Neck:     Thyroid: No thyromegaly or thyroid tenderness.  Cardiovascular:     Rate and Rhythm: Normal rate and regular rhythm.     Heart sounds: Normal heart sounds.  Pulmonary:     Effort: Pulmonary effort is normal.     Breath sounds: Normal breath sounds.  Abdominal:  General: Bowel sounds are normal.  Musculoskeletal:        General: Normal range of motion.     Cervical back: Normal range of motion.  Skin:    General: Skin is warm and dry.  Neurological:     Mental Status: He is alert and oriented to person, place, and time. Mental status is at baseline.  Psychiatric:        Mood and Affect: Mood normal.        Behavior: Behavior normal.        Thought Content: Thought content normal.        Judgment: Judgment normal.           08/06/2023   11:32 AM 07/25/2023    9:10 AM  Depression screen PHQ 2/9  Decreased Interest 0 0  Down, Depressed, Hopeless 0 0   PHQ - 2 Score 0 0  Altered sleeping 0 0  Tired, decreased energy 1 1  Change in appetite 0 0  Feeling bad or failure about yourself  0 0  Trouble concentrating 0 0  Moving slowly or fidgety/restless 0 0  Suicidal thoughts 0 0  PHQ-9 Score 1 1  Difficult doing work/chores Not difficult at all Not difficult at all      08/06/2023   11:32 AM 07/25/2023    9:10 AM  GAD 7 : Generalized Anxiety Score  Nervous, Anxious, on Edge 0 0  Control/stop worrying 0 0  Worry too much - different things 0 0  Trouble relaxing 0 0  Restless 0 0  Easily annoyed or irritable 0 1  Afraid - awful might happen 0 0  Total GAD 7 Score 0 1    ASSESSMENT/PLAN:  Viral URI with cough Assessment & Plan: Experiencing symptoms for seven days, including cough, scratchy throat, congestion, and wheezing, likely due to a viral upper respiratory infection compounded by allergic rhinitis. Symptoms have been resolving since Friday, with the cough subsiding and congestion breaking. Severe allergies managed with Claritin and Nasacort, which have alleviated symptoms. Likely at the tail end of a viral infection with an overlay of allergies. Discussed the use of Astelin nasal spray as an additional treatment for allergies, and Ipratropium nasal spray for rhinorrhea if needed. - Continue Claritin and Nasacort. - Trial Astelin nasal spray for allergies. - Symptomatic management    Allergic rhinitis, unspecified seasonality, unspecified trigger Assessment & Plan: - Continue Claritin and Nasacort. - Trial Astelin nasal spray for allergies.  Orders: -     Azelastine HCl; Place 2 sprays into both nostrils 2 (two) times daily. Use in each nostril as directed  Dispense: 30 mL; Refill: 12 -     Ipratropium Bromide; Place 2 sprays into both nostrils every 12 (twelve) hours.  Dispense: 30 mL; Refill: 12 -     Levocetirizine Dihydrochloride; Take 1 tablet (5 mg total) by mouth every evening.  Dispense: 90 tablet; Refill:  1  Primary hypertension Assessment & Plan: Currently on a low dose of amlodipine but reports side effects, including drowsiness and potential impact on libido. Blood pressure readings at home are around 138/80 mmHg, which is slightly elevated. Discussed the possibility of switching back to losartan if side effects persist, but he prefers to continue with amlodipine for now. Advised monitoring blood pressure closely, especially since he has been using Nyquil, which can elevate blood pressure. - Continue amlodipine and monitor for side effects. - Monitor blood pressure at home. - Avoid using Nyquil and other medications that may  elevate blood pressure.  Orders: -     Comprehensive metabolic panel with GFR; Future  Hypogonadism in male Assessment & Plan: Low testosterone levels may be contributing to symptoms such as decreased libido. Discussed the possibility of testosterone replacement therapy but prefers to refer to a urologist for further evaluation and management. - Follow up urology for evaluation and management of low testosterone.  Orders: -     Testosterone,Free and Total; Future  Thrombocytosis -     CBC with Differential/Platelet; Future  Iron deficiency anemia, unspecified iron deficiency anemia type -     Comprehensive metabolic panel with GFR; Future  Hyperlipidemia, unspecified hyperlipidemia type -     Lipid panel; Future  Vitamin D deficiency -     VITAMIN D 25 Hydroxy (Vit-D Deficiency, Fractures); Future  Elevated PSA Assessment & Plan: Prostate concerns with previous urologist consultation. Plans to follow up with a urologist for ongoing management, especially since it has been more than two years since the last visit. Discussed the need for a referral if not seen in over two years. - Follow up with urology for prostate health evaluation.  Orders: -     PSA; Future  Vitamin B 12 deficiency -     Vitamin B12; Future  Abnormal glucose -     Hemoglobin A1c;  Future  Thyroid disorder screening -     TSH; Future  Gastroesophageal reflux disease, unspecified whether esophagitis present   PDMP reviewed  Return if symptoms worsen or fail to improve, for PCP.  Dana Allan, MD

## 2024-01-24 NOTE — Telephone Encounter (Signed)
 Called patient to notify him that the requested labs have been ordered per Dr Clent Ridges. Unable to leave voice message due to mailbox being full.  OK to relay message if patient calls back. If relayed, please notify the office.

## 2024-01-25 ENCOUNTER — Encounter: Payer: Self-pay | Admitting: Family Medicine

## 2024-01-25 DIAGNOSIS — R7309 Other abnormal glucose: Secondary | ICD-10-CM | POA: Insufficient documentation

## 2024-01-25 DIAGNOSIS — J069 Acute upper respiratory infection, unspecified: Secondary | ICD-10-CM | POA: Insufficient documentation

## 2024-01-25 DIAGNOSIS — Z1329 Encounter for screening for other suspected endocrine disorder: Secondary | ICD-10-CM | POA: Insufficient documentation

## 2024-01-25 DIAGNOSIS — E538 Deficiency of other specified B group vitamins: Secondary | ICD-10-CM | POA: Insufficient documentation

## 2024-01-25 NOTE — Assessment & Plan Note (Addendum)
 Currently on a low dose of amlodipine but reports side effects, including drowsiness and potential impact on libido. Blood pressure readings at home are around 138/80 mmHg, which is slightly elevated. Discussed the possibility of switching back to losartan if side effects persist, but he prefers to continue with amlodipine for now. Advised monitoring blood pressure closely, especially since he has been using Nyquil, which can elevate blood pressure. - Continue amlodipine and monitor for side effects. - Monitor blood pressure at home. - Avoid using Nyquil and other medications that may elevate blood pressure.

## 2024-01-25 NOTE — Assessment & Plan Note (Deleted)
 Experiencing symptoms for seven days, including cough, scratchy throat, congestion, and wheezing, likely due to a viral upper respiratory infection compounded by allergic rhinitis. Symptoms have been resolving since Friday, with the cough subsiding and congestion breaking. Severe allergies managed with Claritin and Nasacort, which have alleviated symptoms. Likely at the tail end of a viral infection with an overlay of allergies. Discussed the use of Astelin nasal spray as an additional treatment for allergies, and Ipratropium nasal spray for rhinorrhea if needed. - Continue Claritin and Nasacort. - Trial Astelin nasal spray for allergies. - Consider Ipratropium nasal spray if experiencing rhinorrhea.

## 2024-01-25 NOTE — Assessment & Plan Note (Signed)
-   Continue Claritin and Nasacort. - Trial Astelin nasal spray for allergies.

## 2024-01-25 NOTE — Assessment & Plan Note (Signed)
 Experiencing symptoms for seven days, including cough, scratchy throat, congestion, and wheezing, likely due to a viral upper respiratory infection compounded by allergic rhinitis. Symptoms have been resolving since Friday, with the cough subsiding and congestion breaking. Severe allergies managed with Claritin and Nasacort, which have alleviated symptoms. Likely at the tail end of a viral infection with an overlay of allergies. Discussed the use of Astelin nasal spray as an additional treatment for allergies, and Ipratropium nasal spray for rhinorrhea if needed. - Continue Claritin and Nasacort. - Trial Astelin nasal spray for allergies. - Symptomatic management

## 2024-01-25 NOTE — Assessment & Plan Note (Signed)
 Prostate concerns with previous urologist consultation. Plans to follow up with a urologist for ongoing management, especially since it has been more than two years since the last visit. Discussed the need for a referral if not seen in over two years. - Follow up with urology for prostate health evaluation.

## 2024-01-25 NOTE — Assessment & Plan Note (Signed)
 Low testosterone levels may be contributing to symptoms such as decreased libido. Discussed the possibility of testosterone replacement therapy but prefers to refer to a urologist for further evaluation and management. - Follow up urology for evaluation and management of low testosterone.

## 2024-01-25 NOTE — Telephone Encounter (Signed)
 Called pt but vm was full unable to leave a message. Please let pt know that labs have been order.

## 2024-01-30 ENCOUNTER — Other Ambulatory Visit: Payer: Self-pay | Admitting: Family Medicine

## 2024-01-30 ENCOUNTER — Other Ambulatory Visit (INDEPENDENT_AMBULATORY_CARE_PROVIDER_SITE_OTHER)

## 2024-01-30 DIAGNOSIS — R7309 Other abnormal glucose: Secondary | ICD-10-CM | POA: Diagnosis not present

## 2024-01-30 DIAGNOSIS — I10 Essential (primary) hypertension: Secondary | ICD-10-CM | POA: Diagnosis not present

## 2024-01-30 DIAGNOSIS — E538 Deficiency of other specified B group vitamins: Secondary | ICD-10-CM

## 2024-01-30 DIAGNOSIS — Z1329 Encounter for screening for other suspected endocrine disorder: Secondary | ICD-10-CM | POA: Diagnosis not present

## 2024-01-30 DIAGNOSIS — R972 Elevated prostate specific antigen [PSA]: Secondary | ICD-10-CM

## 2024-01-30 DIAGNOSIS — E291 Testicular hypofunction: Secondary | ICD-10-CM | POA: Diagnosis not present

## 2024-01-30 DIAGNOSIS — D75839 Thrombocytosis, unspecified: Secondary | ICD-10-CM

## 2024-01-30 DIAGNOSIS — E559 Vitamin D deficiency, unspecified: Secondary | ICD-10-CM

## 2024-01-30 DIAGNOSIS — D649 Anemia, unspecified: Secondary | ICD-10-CM

## 2024-01-30 DIAGNOSIS — D509 Iron deficiency anemia, unspecified: Secondary | ICD-10-CM | POA: Diagnosis not present

## 2024-01-30 DIAGNOSIS — E785 Hyperlipidemia, unspecified: Secondary | ICD-10-CM

## 2024-01-30 LAB — CBC WITH DIFFERENTIAL/PLATELET
Basophils Absolute: 0 10*3/uL (ref 0.0–0.1)
Basophils Relative: 0.5 % (ref 0.0–3.0)
Eosinophils Absolute: 0.2 10*3/uL (ref 0.0–0.7)
Eosinophils Relative: 3.6 % (ref 0.0–5.0)
HCT: 36.6 % — ABNORMAL LOW (ref 39.0–52.0)
Hemoglobin: 12 g/dL — ABNORMAL LOW (ref 13.0–17.0)
Lymphocytes Relative: 31.4 % (ref 12.0–46.0)
Lymphs Abs: 1.8 10*3/uL (ref 0.7–4.0)
MCHC: 32.8 g/dL (ref 30.0–36.0)
MCV: 87.9 fl (ref 78.0–100.0)
Monocytes Absolute: 0.5 10*3/uL (ref 0.1–1.0)
Monocytes Relative: 8 % (ref 3.0–12.0)
Neutro Abs: 3.3 10*3/uL (ref 1.4–7.7)
Neutrophils Relative %: 56.5 % (ref 43.0–77.0)
Platelets: 553 10*3/uL — ABNORMAL HIGH (ref 150.0–400.0)
RBC: 4.16 Mil/uL — ABNORMAL LOW (ref 4.22–5.81)
RDW: 13.6 % (ref 11.5–15.5)
WBC: 5.8 10*3/uL (ref 4.0–10.5)

## 2024-01-30 LAB — LIPID PANEL
Cholesterol: 191 mg/dL (ref 0–200)
HDL: 37.2 mg/dL — ABNORMAL LOW (ref >39.00)
LDL Cholesterol: 122 mg/dL — ABNORMAL HIGH (ref 0–99)
NonHDL: 154.21
Total CHOL/HDL Ratio: 5
Triglycerides: 161 mg/dL — ABNORMAL HIGH (ref 0.0–149.0)
VLDL: 32.2 mg/dL (ref 0.0–40.0)

## 2024-01-30 LAB — PSA: PSA: 8.04 ng/mL — ABNORMAL HIGH (ref 0.10–4.00)

## 2024-01-30 LAB — COMPREHENSIVE METABOLIC PANEL WITH GFR
ALT: 32 U/L (ref 0–53)
AST: 25 U/L (ref 0–37)
Albumin: 4.7 g/dL (ref 3.5–5.2)
Alkaline Phosphatase: 69 U/L (ref 39–117)
BUN: 15 mg/dL (ref 6–23)
CO2: 30 meq/L (ref 19–32)
Calcium: 10.1 mg/dL (ref 8.4–10.5)
Chloride: 101 meq/L (ref 96–112)
Creatinine, Ser: 1.25 mg/dL (ref 0.40–1.50)
GFR: 61.88 mL/min (ref >60.00)
Glucose, Bld: 104 mg/dL — ABNORMAL HIGH (ref 70–99)
Potassium: 4.5 meq/L (ref 3.5–5.1)
Sodium: 139 meq/L (ref 135–145)
Total Bilirubin: 0.6 mg/dL (ref 0.2–1.2)
Total Protein: 7.8 g/dL (ref 6.0–8.3)

## 2024-01-30 LAB — TSH: TSH: 1.17 u[IU]/mL (ref 0.35–5.50)

## 2024-01-30 LAB — VITAMIN D 25 HYDROXY (VIT D DEFICIENCY, FRACTURES): VITD: 25.33 ng/mL — ABNORMAL LOW (ref 30.00–100.00)

## 2024-01-30 LAB — VITAMIN B12: Vitamin B-12: 590 pg/mL (ref 211–911)

## 2024-01-30 LAB — HEMOGLOBIN A1C: Hgb A1c MFr Bld: 5.3 % (ref 4.6–6.5)

## 2024-01-30 MED ORDER — VITAMIN D (ERGOCALCIFEROL) 1.25 MG (50000 UNIT) PO CAPS: 50000.0000 [IU] | ORAL_CAPSULE | ORAL | 1 refills | Status: DC

## 2024-01-31 ENCOUNTER — Ambulatory Visit (INDEPENDENT_AMBULATORY_CARE_PROVIDER_SITE_OTHER)

## 2024-01-31 DIAGNOSIS — D649 Anemia, unspecified: Secondary | ICD-10-CM

## 2024-01-31 LAB — IBC + FERRITIN
Ferritin: 22.9 ng/mL (ref 22.0–322.0)
Iron: 74 ug/dL (ref 42–165)
Saturation Ratios: 16.8 % — ABNORMAL LOW (ref 20.0–50.0)
TIBC: 439.6 ug/dL (ref 250.0–450.0)
Transferrin: 314 mg/dL (ref 212.0–360.0)

## 2024-02-01 ENCOUNTER — Other Ambulatory Visit: Payer: Self-pay

## 2024-02-01 DIAGNOSIS — E785 Hyperlipidemia, unspecified: Secondary | ICD-10-CM

## 2024-02-01 MED ORDER — ROSUVASTATIN CALCIUM 20 MG PO TABS: 20.0000 mg | ORAL_TABLET | Freq: Every day | ORAL | 3 refills | Status: DC

## 2024-02-02 LAB — TESTOSTERONE,FREE AND TOTAL
Testosterone, Free: 7.7 pg/mL (ref 6.6–18.1)
Testosterone: 261 ng/dL — ABNORMAL LOW (ref 264–916)

## 2024-02-21 ENCOUNTER — Inpatient Hospital Stay: Attending: Oncology

## 2024-02-21 DIAGNOSIS — I10 Essential (primary) hypertension: Secondary | ICD-10-CM | POA: Diagnosis not present

## 2024-02-21 DIAGNOSIS — D509 Iron deficiency anemia, unspecified: Secondary | ICD-10-CM | POA: Diagnosis present

## 2024-02-21 DIAGNOSIS — D500 Iron deficiency anemia secondary to blood loss (chronic): Secondary | ICD-10-CM

## 2024-02-21 DIAGNOSIS — Z8719 Personal history of other diseases of the digestive system: Secondary | ICD-10-CM | POA: Diagnosis not present

## 2024-02-21 DIAGNOSIS — K649 Unspecified hemorrhoids: Secondary | ICD-10-CM | POA: Diagnosis not present

## 2024-02-21 DIAGNOSIS — Z79899 Other long term (current) drug therapy: Secondary | ICD-10-CM | POA: Diagnosis not present

## 2024-02-21 DIAGNOSIS — Z8042 Family history of malignant neoplasm of prostate: Secondary | ICD-10-CM | POA: Diagnosis not present

## 2024-02-21 DIAGNOSIS — D751 Secondary polycythemia: Secondary | ICD-10-CM | POA: Diagnosis not present

## 2024-02-21 DIAGNOSIS — Z87891 Personal history of nicotine dependence: Secondary | ICD-10-CM | POA: Diagnosis not present

## 2024-02-21 LAB — IRON AND TIBC
Iron: 49 ug/dL (ref 45–182)
Saturation Ratios: 12 % — ABNORMAL LOW (ref 17.9–39.5)
TIBC: 424 ug/dL (ref 250–450)
UIBC: 375 ug/dL

## 2024-02-21 LAB — CBC WITH DIFFERENTIAL (CANCER CENTER ONLY)
Abs Immature Granulocytes: 0.01 10*3/uL (ref 0.00–0.07)
Basophils Absolute: 0 10*3/uL (ref 0.0–0.1)
Basophils Relative: 1 %
Eosinophils Absolute: 0.1 10*3/uL (ref 0.0–0.5)
Eosinophils Relative: 2 %
HCT: 35.6 % — ABNORMAL LOW (ref 39.0–52.0)
Hemoglobin: 11.3 g/dL — ABNORMAL LOW (ref 13.0–17.0)
Immature Granulocytes: 0 %
Lymphocytes Relative: 28 %
Lymphs Abs: 1.4 10*3/uL (ref 0.7–4.0)
MCH: 28.3 pg (ref 26.0–34.0)
MCHC: 31.7 g/dL (ref 30.0–36.0)
MCV: 89 fL (ref 80.0–100.0)
Monocytes Absolute: 0.3 10*3/uL (ref 0.1–1.0)
Monocytes Relative: 6 %
Neutro Abs: 3.2 10*3/uL (ref 1.7–7.7)
Neutrophils Relative %: 63 %
Platelet Count: 387 10*3/uL (ref 150–400)
RBC: 4 MIL/uL — ABNORMAL LOW (ref 4.22–5.81)
RDW: 12.3 % (ref 11.5–15.5)
WBC Count: 5.1 10*3/uL (ref 4.0–10.5)
nRBC: 0 % (ref 0.0–0.2)

## 2024-02-21 LAB — FERRITIN: Ferritin: 24 ng/mL (ref 24–336)

## 2024-02-22 ENCOUNTER — Ambulatory Visit: Admitting: Urology

## 2024-02-22 ENCOUNTER — Encounter: Payer: Self-pay | Admitting: Urology

## 2024-02-22 VITALS — BP 130/80 | HR 74 | Ht 70.0 in | Wt 222.0 lb

## 2024-02-22 DIAGNOSIS — R972 Elevated prostate specific antigen [PSA]: Secondary | ICD-10-CM | POA: Diagnosis not present

## 2024-02-22 NOTE — Patient Instructions (Signed)
 Please contact Central Scheduling to set up your prostate MRI at [phone].  Prostate MRI Prep:  1- No ejaculation 48 hours prior to exam  2- No caffeine or carbonated beverages on day of the exam  3- Eat light diet evening prior and day of exam  4- Avoid eating 4 hours prior to exam  5- Fleets enema needs to be done 4 hours prior to exam -See below. Can be purchased at the drug store.

## 2024-02-22 NOTE — Progress Notes (Signed)
 I, Maysun Jamey Mccallum, acting as a scribe for Geraline Knapp, MD., have documented all relevant documentation on the behalf of Geraline Knapp, MD, as directed by Geraline Knapp, MD while in the presence of Geraline Knapp, MD.  02/22/2024 3:46 PM   Fidencio Hue Granholm [birth year] [id number]  Referring provider: Valli Gaw, MD [address] [place],  [place] [address]  Chief Complaint  Patient presents with   Hypogonadism    HPI: UDELL RHUE is a 63 y.o. male referred for evaluation of an elevated PSA.  Prior prostate MRI June 2022 for a PSA of 4.88. Prostate volume was 41 cc and no lesions were suspicious for high-grade prostate cancer. He was being followed by Dr. Fredrick Jenkins and surveillance was recommended.  PSA October 2024 was 8.42. It was repeated later that month and was 7.88. Repeat PSA April 2025 was 8.04 No bothersome LUTS No dysuria or gross hematuria No flank, abdominal or pelvic pain.   PSA trend   PSA  Latest Ref Rng 0.10 - 4.00 ng/mL  08/01/2023 8.42 (H)   08/08/2023 7.88 (H)   01/30/2024 8.04 (H)     PMH: Past Medical History:  Diagnosis Date   Allergy    Heart murmur    Hypertension     Surgical History: Past Surgical History:  Procedure Laterality Date   COLONOSCOPY WITH PROPOFOL  N/A 11/14/2022   Procedure: COLONOSCOPY WITH PROPOFOL ;  Surgeon: Marnee Sink, MD;  Location: [facility] ENDOSCOPY;  Service: Endoscopy;  Laterality: N/A;   ESOPHAGOGASTRODUODENOSCOPY N/A 11/14/2022   Procedure: ESOPHAGOGASTRODUODENOSCOPY (EGD);  Surgeon: Marnee Sink, MD;  Location: [facility] ENDOSCOPY;  Service: Endoscopy;  Laterality: N/A;   KNEE SURGERY      Home Medications:  Allergies as of 02/22/2024   No Known Allergies      Medication List        Accurate as of Feb 22, 2024  3:46 PM. If you have any questions, ask your nurse or doctor.          amitriptyline 10 MG tablet Commonly known as: ELAVIL Take by mouth.   amLODipine  5 MG tablet Commonly known as:  NORVASC  TAKE 0.5 TABLETS (2.5 MG TOTAL) BY MOUTH AT BEDTIME.   ascorbic acid 1000 MG tablet Commonly known as: VITAMIN C Take by mouth.   azelastine  0.1 % nasal spray Commonly known as: ASTELIN  Place 2 sprays into both nostrils 2 (two) times daily. Use in each nostril as directed   ipratropium 0.03 % nasal spray Commonly known as: ATROVENT  Place 2 sprays into both nostrils every 12 (twelve) hours.   levocetirizine 5 MG tablet Commonly known as: XYZAL  Take 1 tablet (5 mg total) by mouth every evening.   multivitamin capsule Take 1 capsule by mouth daily.   pantoprazole  40 MG tablet Commonly known as: PROTONIX  Take 20 mg by mouth daily.   rosuvastatin  20 MG tablet Commonly known as: Crestor  Take 1 tablet (20 mg total) by mouth daily.   triamcinolone 55 MCG/ACT Aero nasal inhaler Commonly known as: NASACORT Place into the nose.   triamcinolone cream 0.1 % Commonly known as: KENALOG Apply topically daily.   Vitamin D  (Ergocalciferol ) 1.25 MG (50000 UNIT) Caps capsule Commonly known as: DRISDOL  Take 1 capsule (50,000 Units total) by mouth every 7 (seven) days.        Allergies: No Known Allergies  Family History: Family History  Problem Relation Age of Onset   Hypertension Mother    Kidney failure Mother  Diabetes Mother    Prostate cancer Father    Diabetes Paternal Grandmother     Social History:  reports that he quit smoking about 27 years ago. His smoking use included cigarettes. He has never used smokeless tobacco. He reports current alcohol use of about 1.0 standard drink of alcohol per week. He reports that he does not use drugs.   Physical Exam: BP 130/80   Pulse 74   Ht 5\' 10"  (1.778 m)   Wt 222 lb (100.7 kg)   BMI 31.85 kg/m   Constitutional:  Alert and oriented, No acute distress. HEENT: [place] AT, moist mucus membranes.  Trachea midline, no masses. Cardiovascular: No clubbing, cyanosis, or edema. Respiratory: Normal respiratory effort, no  increased work of breathing. GI: Abdomen is soft, nontender, nondistended, no abdominal masses GU: Prostate 50 grams, smooth without nodules.  Skin: No rashes, bruises or suspicious lesions. Neurologic: Grossly intact, no focal deficits, moving all 4 extremities. Psychiatric: Normal mood and affect.   Assessment & Plan:    1. Elevated PSA Patient states he thinks his PSA was in the 8-range when he had his MRI 2022, though on record review the net was 4.88 Benign DRE Recommend further evaluation with repeat prostate MRI. We discussed possible recommendation of prostate biopsy if he has any abnormalities suspicious for high-grade prostate cancer.  [facility] [address], [address] [place], [place] [address] [phone]

## 2024-02-27 ENCOUNTER — Encounter: Payer: Self-pay | Admitting: Oncology

## 2024-02-27 ENCOUNTER — Inpatient Hospital Stay (HOSPITAL_BASED_OUTPATIENT_CLINIC_OR_DEPARTMENT_OTHER): Admitting: Oncology

## 2024-02-27 ENCOUNTER — Inpatient Hospital Stay: Payer: Self-pay

## 2024-02-27 VITALS — BP 146/84 | HR 78 | Temp 97.7°F | Resp 18 | Wt 225.1 lb

## 2024-02-27 VITALS — BP 155/77 | HR 63 | Temp 97.0°F | Resp 18

## 2024-02-27 DIAGNOSIS — D75839 Thrombocytosis, unspecified: Secondary | ICD-10-CM | POA: Diagnosis not present

## 2024-02-27 DIAGNOSIS — K649 Unspecified hemorrhoids: Secondary | ICD-10-CM

## 2024-02-27 DIAGNOSIS — D500 Iron deficiency anemia secondary to blood loss (chronic): Secondary | ICD-10-CM

## 2024-02-27 DIAGNOSIS — D508 Other iron deficiency anemias: Secondary | ICD-10-CM

## 2024-02-27 DIAGNOSIS — D509 Iron deficiency anemia, unspecified: Secondary | ICD-10-CM | POA: Diagnosis not present

## 2024-02-27 MED ORDER — SODIUM CHLORIDE 0.9% FLUSH
10.0000 mL | Freq: Once | INTRAVENOUS | Status: AC | PRN
  Administered 2024-02-27: 10 mL
  Filled 2024-02-27: qty 10

## 2024-02-27 MED ORDER — IRON SUCROSE 20 MG/ML IV SOLN
200.0000 mg | Freq: Once | INTRAVENOUS | Status: AC
  Administered 2024-02-27: 200 mg via INTRAVENOUS

## 2024-02-27 NOTE — Progress Notes (Signed)
 Hematology/Oncology Progress note [phone] [phone]            Patient Care Team: Valli Gaw, MD as PCP - General (Family Medicine) Timmy Forbes, MD as Consulting Physician (Oncology)  ASSESSMENT & PLAN:   IDA (iron  deficiency anemia) Labs are reviewed and discussed with patient. Lab Results  Component Value Date   HGB 11.3 (L) 02/21/2024   TIBC 424 02/21/2024   IRONPCTSAT 12 (L) 02/21/2024   FERRITIN 24 02/21/2024    Recurrent iron  deficiency anemia. Recommend IV Venofer  weekly x 3   Thrombocytosis Due to iron  deficiency anemia.  Normalized.  Monitor.   Hemorrhoids Intermittently he has hemorrhoid bleeding.  He would like to be referred to [facility] given that his GI Dr. Ole Berkeley will no longer see outpatient patient.   Follow up in 6 months- lab prior to MD + venofer . Iron  labs retic panel.   Orders Placed This Encounter  Procedures   CBC with Differential (Cancer Center Only)    Standing Status:   Future    Expected Date:   08/29/2024    Expiration Date:   02/26/2025   Iron  and TIBC    Standing Status:   Future    Expected Date:   08/29/2024    Expiration Date:   02/26/2025   Ferritin    Standing Status:   Future    Expected Date:   08/29/2024    Expiration Date:   02/26/2025   Retic Panel    Standing Status:   Future    Expected Date:   08/29/2024    Expiration Date:   02/26/2025   Ambulatory referral to Gastroenterology    Referral Priority:   Routine    Referral Type:   Consultation    Referral Reason:   Specialty Services Required    Number of Visits Requested:   1    All questions were answered. The patient knows to call the clinic with any problems, questions or concerns.  Timmy Forbes, MD, PhD [facility] Hematology Oncology 02/27/2024   CHIEF COMPLAINTS/REASON FOR VISIT:  Follow-up for iron  deficiency anemia  HISTORY OF PRESENTING ILLNESS:   Randy Sawyer is a  63 y.o.  male presents for follow up of anemia  Patient reports  that he was previously evaluated by hematologist years ago and was told everything was fine. Patient has a chronically decreased hemoglobin, dated back to at least 2015.  His baseline is around 11 02/20/2022, hemoglobin has decreased to 9.9, MCV 89.2.  Patient was referred to establish care with hematology for further evaluation. Patient reports chronic history of intermittent blood in the stool.  He reports a colonoscopy in 6 to 7 years, not available in current EMR. Denies any unintentional weight loss, fever, night sweats. He is also over-the-counter supplements including alpha king supreme, -testpsteron booster, yohimbe 451 ultra saw palmetto .  Since his last blood work, he has stopped testosterone . Patient drinks occasionally.   He had a colonoscopy and upper endoscopy performed 09/01/13: Colonoscopy showed small internal hemorrhoids, normal prostate. Exam otherwise normal. The upper endoscopy showed a normal esophagus, non bleeding erosive gastropathy described as two dispersed small non bleeding erosions found in the gastric antrum. There is no stigmata of recent bleeding. There is normal duodenum. Pathology returned chronic gastritis with intestinal metaplasia and atrophy. Negative for Helicobacter pylori, negative for dysplasia.   Repeat colonoscopy requested in 10 years, no repeat EGD requested. Patient was found to have H pylori IGG positive testing before  the luminal evaluation and he was treated with Prev pac. Patient had a negative post H pylori treatment stool antigen.    .INTERVAL HISTORY Randy Sawyer is a 63 y.o. male who has above history reviewed by me today presents for follow up visit for management of iron  deficiency anemia Patient reports feeling well.  Patient missed appointment in November 2024. Patient continues to have intermittent hemorrhoid bleeding.  No other new complaints.     Review of Systems  Constitutional:  Negative for appetite change, chills, fatigue,  fever and unexpected weight change.  HENT:   Negative for hearing loss and voice change.   Eyes:  Negative for eye problems and icterus.  Respiratory:  Negative for chest tightness, cough and shortness of breath.   Cardiovascular:  Negative for chest pain and leg swelling.  Gastrointestinal:  Positive for blood in stool. Negative for abdominal distention and abdominal pain.  Endocrine: Negative for hot flashes.  Genitourinary:  Negative for difficulty urinating, dysuria and frequency.   Musculoskeletal:  Negative for arthralgias.  Skin:  Negative for itching and rash.  Neurological:  Negative for light-headedness and numbness.  Hematological:  Negative for adenopathy. Does not bruise/bleed easily.  Psychiatric/Behavioral:  Negative for confusion.     MEDICAL HISTORY:  Past Medical History:  Diagnosis Date   Allergy    Heart murmur    Hypertension     SURGICAL HISTORY: Past Surgical History:  Procedure Laterality Date   COLONOSCOPY WITH PROPOFOL  N/A 11/14/2022   Procedure: COLONOSCOPY WITH PROPOFOL ;  Surgeon: Marnee Sink, MD;  Location: [facility] ENDOSCOPY;  Service: Endoscopy;  Laterality: N/A;   ESOPHAGOGASTRODUODENOSCOPY N/A 11/14/2022   Procedure: ESOPHAGOGASTRODUODENOSCOPY (EGD);  Surgeon: Marnee Sink, MD;  Location: [facility] ENDOSCOPY;  Service: Endoscopy;  Laterality: N/A;   KNEE SURGERY      SOCIAL HISTORY: Social History   Socioeconomic History   Marital status: Single    Spouse name: Not on file   Number of children: Not on file   Years of education: Not on file   Highest education level: Not on file  Occupational History   Not on file  Tobacco Use   Smoking status: Former    Current packs/day: 0.00    Types: Cigarettes    Quit date: 14    Years since quitting: 27.3   Smokeless tobacco: Never  Vaping Use   Vaping status: Never Used  Substance and Sexual Activity   Alcohol use: Yes    Alcohol/week: 1.0 standard drink of alcohol    Types: 1 Glasses of wine per  week    Comment: occationally.   Drug use: Never   Sexual activity: Yes  Other Topics Concern   Not on file  Social History Narrative   Not on file   Social Drivers of Health   Financial Resource Strain: Not on file  Food Insecurity: Not on file  Transportation Needs: Not on file  Physical Activity: Not on file  Stress: Not on file  Social Connections: Not on file  Intimate Partner Violence: Not on file    FAMILY HISTORY: Family History  Problem Relation Age of Onset   Hypertension Mother    Kidney failure Mother    Diabetes Mother    Prostate cancer Father    Diabetes Paternal Grandmother     ALLERGIES:  has no known allergies.  MEDICATIONS:  Current Outpatient Medications  Medication Sig Dispense Refill   amitriptyline (ELAVIL) 10 MG tablet Take by mouth.     amLODipine  (NORVASC )  5 MG tablet TAKE 0.5 TABLETS (2.5 MG TOTAL) BY MOUTH AT BEDTIME. 45 tablet 1   ascorbic acid (VITAMIN C) 1000 MG tablet Take by mouth.     azelastine  (ASTELIN ) 0.1 % nasal spray Place 2 sprays into both nostrils 2 (two) times daily. Use in each nostril as directed 30 mL 12   ipratropium (ATROVENT ) 0.03 % nasal spray Place 2 sprays into both nostrils every 12 (twelve) hours. 30 mL 12   levocetirizine (XYZAL ) 5 MG tablet Take 1 tablet (5 mg total) by mouth every evening. 90 tablet 1   Multiple Vitamin (MULTIVITAMIN) capsule Take 1 capsule by mouth daily.     pantoprazole  (PROTONIX ) 40 MG tablet Take 20 mg by mouth daily.     rosuvastatin  (CRESTOR ) 20 MG tablet Take 1 tablet (20 mg total) by mouth daily. 90 tablet 3   triamcinolone (NASACORT) 55 MCG/ACT AERO nasal inhaler Place into the nose.     triamcinolone cream (KENALOG) 0.1 % Apply topically daily.     Vitamin D , Ergocalciferol , (DRISDOL ) 1.25 MG (50000 UNIT) CAPS capsule Take 1 capsule (50,000 Units total) by mouth every 7 (seven) days. 12 capsule 1   No current facility-administered medications for this visit.     PHYSICAL  EXAMINATION: Vitals:   02/27/24 1413 02/27/24 1431  BP: (!) 153/86 (!) 146/84  Pulse: 78   Resp: 18   Temp: 97.7 F (36.5 C)   SpO2: 100%    Filed Weights   02/27/24 1413  Weight: 225 lb 1.6 oz (102.1 kg)    Physical Exam Constitutional:      General: He is not in acute distress.    Appearance: He is obese.  HENT:     Head: Normocephalic and atraumatic.  Eyes:     General: No scleral icterus. Cardiovascular:     Rate and Rhythm: Normal rate and regular rhythm.  Pulmonary:     Effort: Pulmonary effort is normal. No respiratory distress.     Breath sounds: No wheezing.  Abdominal:     General: Bowel sounds are normal. There is no distension.     Palpations: Abdomen is soft.  Musculoskeletal:        General: No deformity. Normal range of motion.     Cervical back: Normal range of motion and neck supple.  Skin:    General: Skin is warm and dry.     Findings: No erythema or rash.  Neurological:     Mental Status: He is alert and oriented to person, place, and time. Mental status is at baseline.  Psychiatric:        Mood and Affect: Mood normal.     LABORATORY DATA:  I have reviewed the data as listed Lab Results  Component Value Date   WBC 5.1 02/21/2024   HGB 11.3 (L) 02/21/2024   HCT 35.6 (L) 02/21/2024   MCV 89.0 02/21/2024   PLT 387 02/21/2024   Recent Labs    08/01/23 0739 01/30/24 0736  NA 141 139  K 4.0 4.5  CL 103 101  CO2 31 30  GLUCOSE 108* 104*  BUN 9 15  CREATININE 1.19 1.25  CALCIUM  9.8 10.1  PROT 7.1 7.8  ALBUMIN 4.4 4.7  AST 30 25  ALT 36 32  ALKPHOS 72 69  BILITOT 0.4 0.6   Iron /TIBC/Ferritin/ %Sat    Component Value Date/Time   IRON  49 02/21/2024 0907   TIBC 424 02/21/2024 0907   FERRITIN 24 02/21/2024 0907   IRONPCTSAT 12 (L)  02/21/2024 0907      RADIOGRAPHIC STUDIES: I have personally reviewed the radiological images as listed and agreed with the findings in the report. No results found.

## 2024-02-27 NOTE — Assessment & Plan Note (Addendum)
 Labs are reviewed and discussed with patient. Lab Results  Component Value Date   HGB 11.3 (L) 02/21/2024   TIBC 424 02/21/2024   IRONPCTSAT 12 (L) 02/21/2024   FERRITIN 24 02/21/2024    Recurrent iron  deficiency anemia. Recommend IV Venofer  weekly x 3

## 2024-02-27 NOTE — Assessment & Plan Note (Signed)
 Intermittently he has hemorrhoid bleeding.  He would like to be referred to [facility] GI given that his GI Dr. Ole Berkeley will no longer see outpatient patient.

## 2024-02-27 NOTE — Assessment & Plan Note (Addendum)
 Due to iron  deficiency anemia.  Normalized.  Monitor.

## 2024-02-29 ENCOUNTER — Telehealth: Payer: Self-pay

## 2024-02-29 NOTE — Telephone Encounter (Signed)
 GI referral faxed to [facility]. Re: IDA, hemorrhoids. Fax confirmation received.

## 2024-03-05 ENCOUNTER — Inpatient Hospital Stay

## 2024-03-05 VITALS — BP 162/85 | HR 60 | Temp 97.0°F

## 2024-03-05 DIAGNOSIS — D508 Other iron deficiency anemias: Secondary | ICD-10-CM

## 2024-03-05 DIAGNOSIS — D509 Iron deficiency anemia, unspecified: Secondary | ICD-10-CM | POA: Diagnosis not present

## 2024-03-05 MED ORDER — SODIUM CHLORIDE 0.9% FLUSH
10.0000 mL | Freq: Once | INTRAVENOUS | Status: AC | PRN
Start: 2024-03-05 — End: 2024-03-05
  Administered 2024-03-05: 10 mL
  Filled 2024-03-05: qty 10

## 2024-03-05 MED ORDER — IRON SUCROSE 20 MG/ML IV SOLN
200.0000 mg | Freq: Once | INTRAVENOUS | Status: AC
  Administered 2024-03-05: 200 mg via INTRAVENOUS
  Filled 2024-03-05: qty 10

## 2024-03-05 NOTE — Progress Notes (Signed)
 Patient tolerated Venofer infusion well, no questions/concerns voiced. Monitored 30 min post transfusion. Patient stable at discharge. VSS. Refused AVS .

## 2024-03-05 NOTE — Patient Instructions (Signed)

## 2024-03-12 ENCOUNTER — Inpatient Hospital Stay

## 2024-03-12 VITALS — BP 127/77 | HR 69 | Temp 97.4°F | Resp 18

## 2024-03-12 DIAGNOSIS — D508 Other iron deficiency anemias: Secondary | ICD-10-CM

## 2024-03-12 DIAGNOSIS — D509 Iron deficiency anemia, unspecified: Secondary | ICD-10-CM | POA: Diagnosis not present

## 2024-03-12 MED ORDER — IRON SUCROSE 20 MG/ML IV SOLN
200.0000 mg | Freq: Once | INTRAVENOUS | Status: AC
  Administered 2024-03-12: 200 mg via INTRAVENOUS

## 2024-04-14 ENCOUNTER — Ambulatory Visit

## 2024-04-16 ENCOUNTER — Other Ambulatory Visit: Payer: Self-pay | Admitting: Family Medicine

## 2024-04-16 DIAGNOSIS — I10 Essential (primary) hypertension: Secondary | ICD-10-CM

## 2024-04-18 ENCOUNTER — Ambulatory Visit
Admission: RE | Admit: 2024-04-18 | Discharge: 2024-04-18 | Disposition: A | Discharge status: Home / Self Care | Source: Ambulatory Visit | Attending: Urology | Admitting: Urology

## 2024-04-18 DIAGNOSIS — R972 Elevated prostate specific antigen [PSA]: Secondary | ICD-10-CM | POA: Insufficient documentation

## 2024-04-18 MED ORDER — GADOBUTROL 1 MMOL/ML IV SOLN
10.0000 mL | Freq: Once | INTRAVENOUS | Status: AC | PRN
  Administered 2024-04-18: 10 mL via INTRAVENOUS

## 2024-04-19 ENCOUNTER — Ambulatory Visit: Payer: Self-pay | Admitting: Urology

## 2024-05-20 ENCOUNTER — Telehealth: Payer: Self-pay | Admitting: Urology

## 2024-05-20 NOTE — Telephone Encounter (Signed)
 Called pt but could not leave a VM due to it is full to schedule appt.

## 2024-05-20 NOTE — Telephone Encounter (Signed)
-----   Message from Redell JAYSON Burnet sent at 05/20/2024  8:22 AM EDT ----- Regarding: RE: Change of Providers Ok, please just document in chart patient requested change  Redell Burnet, MD 05/20/2024 ----- Message ----- From: Alix Huxley Sent: 05/19/2024   3:48 PM EDT To: Redell JAYSON Burnet, MD Subject: Change of Providers                            Good afternoon,  The patient came into the office today requesting to switch his provider from Dr. Twylla to you. He mentioned that he had spoken with his pastor, Medford Keep is one of your patients-and was told that you are very thorough in explaining medical information and procedures.  He feels that Dr. Twylla doesn't explain things in a way that works well for him and would prefer a provider who communicates more clearly. He is aware that he needs a biopsy and possibly an MRI, and would like to proceed with those under your care.  Would it be okay to make the switch?  Thank you, Huxley

## 2024-06-06 ENCOUNTER — Ambulatory Visit: Admitting: Urology

## 2024-06-06 VITALS — BP 182/93 | HR 57 | Wt 213.0 lb

## 2024-06-06 DIAGNOSIS — R972 Elevated prostate specific antigen [PSA]: Secondary | ICD-10-CM | POA: Diagnosis not present

## 2024-06-06 NOTE — Patient Instructions (Addendum)
 Transrectal Prostate Biopsy Patient Education and Post Procedure Instructions    -Definition A prostate biopsy is the removal of a small amount of tissue from the prostate gland. The tissue is examined to determine whether there is cancer.  -Reasons for Procedure A prostate biopsy is usually done after an abnormal finding by: Digital rectal exam Prostate specific antigen (PSA) blood test A prostate biopsy is the only way to find out if cancer cells are present.  -Possible Complications Problems from the procedure are rare, but all procedures have some risk including: Infection Bruising or lengthy bleeding from the rectum, or in urine or semen Difficulty urinating Reactions to anesthesia Factors that may increase the risk of complications include: Smoking History of bleeding disorders or easy bruising Use of any medications, over-the-counter medications, or herbal supplements Sensitivity or allergy to latex, medications, or anesthesia.  -Prior to Procedure Talk to your doctor about your medications. Blood thinning medications including aspirin should be stopped 1 week prior to procedure. If prescribed by your cardiologist we may need approval before stopping medications. Use a Fleets enema 2 hours before the procedure. Can be purchased at your pharmacy. Antibiotics will be administered in the clinic prior to procedure.  Please make sure you eat a light meal prior to coming in for your appointment. This can help prevent lightheadedness during the procedure and upset stomach from antibiotics. Please bring someone with you to the procedure to drive you home.  -Anesthesia Transrectal biopsy: Local anesthesia--Just the area that is being operated on is numbed using an injectable anesthetic.  -Description of the Procedure Transrectal biopsy--Your doctor will insert a small ultrasound device into the rectum. This device will produce sound waves to create an image of the prostate.  These images will help guide placement of the needle. Your doctor will then insert the needle through the wall of the rectum and into the prostate gland. The procedure should take approximately 15-30 minutes.  -Will It Hurt? You may have discomfort and soreness at the biopsy site. Pain and discomfort after the procedure can be managed with medications.  -Postoperative Care When you return home after the procedure, do the following to help ensure a smooth recovery: Stay hydrated. Drink plenty of fluids for the next few days. Avoid difficult physical activity the day and evening of the procedure. Keep in mind that you may see blood in your urine, stool, or semen for several days. Resume any medications that were stopped when you are advised to do so.  After the sample is taken, it will be sent to a pathologist for examination under a microscope. This doctor will analyze the sample for cancer. You will be scheduled for an appointment to discuss results. If cancer is present, your doctor will work with you to develop a treatment plan.   -Call Your Doctor or Seek Immediate Medical Attention It is important to monitor your recovery. Alert your doctor to any problems. If any of the following occur, call your doctor or go to the emergency room: Fever 100.5 or greater within 1 week post procedure go directly to ER Call the office for: Blood in the urine more than 1 week or in semen for more than 6 weeks post-biopsy Pain that you cannot control with the medications you have been given Pain, burning, urgency, or frequency of urination Cough, shortness of breath, or chest pain- if severe go to ER Heavy rectal bleeding or bleeding that lasts more than 1 week after the biopsy If you have any  questions or concerns please contact our office at [phone]  [facility] [address] [place], [place] [address] [phone]         Transrectal Ultrasound-Guided Prostate Biopsy A  transrectal ultrasound-guided prostate biopsy is a procedure to remove samples of prostate tissue for testing. The prostate is a walnut-sized gland that is located below the bladder and in front of the rectum. During this procedure, a small device (probe) is lubricated and put inside the rectum. The probe sends out sound waves that make a picture of the prostate and surrounding tissues (transrectal ultrasound). The images are used to help guide the process of removing the samples. The samples are taken to a lab to be checked for prostate cancer. This procedure is usually done to evaluate the prostate gland of men who have raised (elevated) levels of prostate-specific antigen (PSA), which can be a sign of prostate cancer or prostate enlargement related to aging (benign prostatic hyperplasia, or BPH). Tell a health care provider about: Any allergies you have. All medicines you are taking, including vitamins, herbs, eye drops, creams, and over-the-counter medicines. Any problems you or family members have had with anesthetic medicines. Any bleeding problems you have. Any surgeries you have had. Any medical conditions you have. Any prostate infections you have had. What are the risks? Generally, this is a safe procedure. However, problems may occur, including: Prostate infection. Bleeding from the rectum. Blood in the urine. Allergic reactions to medicines. Damage to surrounding structures such as blood vessels, organs, or muscles. Difficulty passing urine. Nerve damage. This is usually temporary. What happens before the procedure? Medicines Ask your health care provider about: Changing or stopping your regular medicines. This is especially important if you are taking diabetes medicines or blood thinners. Taking medicines such as aspirin and ibuprofen . These medicines can thin your blood. Do not take these medicines unless your health care provider tells you to take them. Taking over-the-counter  medicines, vitamins, herbs, and supplements. General instructions Follow instructions from your health care provider about eating and drinking. In most instances, you will not need to stop eating and drinking completely before the procedure. You will be given an enema. During an enema, a liquid is injected into your rectum to clear out waste. You may have a blood or urine sample taken. Ask your health care provider what steps will be taken to help prevent infection. These steps may include: Washing skin with a germ-killing soap. Taking antibiotic medicine. If you will be going home right after the procedure, plan to have a responsible adult: Take you home from the hospital or clinic. You will not be allowed to drive. Care for you for the time you are told. What happens during the procedure?  An IV will be inserted into one of your veins. You will be given one or both of the following: A medicine to help you relax (sedative). A medicine to numb the area (local anesthetic). You will be placed on your left side, and your knees will be bent toward your chest. A probe with lubricated gel will be placed into your rectum, and images will be taken of your prostate and surrounding structures. Numbing medicine will be injected into your prostate. A biopsy needle will be inserted through your rectum or perineum and guided to your prostate using the ultrasound images. Prostate tissue samples will be removed, and the needle and probe will then be removed. The biopsy samples will be sent to a lab to be tested. The procedure  may vary among health care providers and hospitals. What happens after the procedure? Your blood pressure, heart rate, breathing rate, and blood oxygen level will be monitored until you leave the hospital or clinic. You may have some discomfort in the rectal area. You will be given pain medicine as needed. If you were given a sedative during the procedure, it can affect you for  several hours. Do not drive or operate machinery until your health care provider says that it is safe. It is up to you to get the results of your procedure. Ask your health care provider, or the department that is doing the procedure, when your results will be ready. Keep all follow-up visits. This is important. Summary A transrectal ultrasound-guided biopsy removes samples of tissue from your prostate using ultrasound-guided sound waves to help guide the process. This procedure is usually done to evaluate the prostate gland of men who have raised (elevated) levels of prostate-specific antigen (PSA), which can be a sign of prostate cancer or prostate enlargement related to aging. After your procedure, you may feel some discomfort in the rectal area. Plan to have a responsible adult take you home from the hospital or clinic, and follow up with your health care provider for your results. This information is not intended to replace advice given to you by your health care provider. Make sure you discuss any questions you have with your health care provider. Document Revised: 04/04/2021 Document Reviewed: 04/04/2021 Elsevier Patient Education  2024 [employer].

## 2024-06-06 NOTE — Progress Notes (Signed)
   06/06/2024 10:40 AM   Randy Sawyer 12-Nov-1960 [id number]  Reason for visit: Follow up elevated PSA, abnormal prostate MRI  HPI: 63 year old male who had been followed by Dr. Gala and Dr. Twylla, and requested an appointment with me prior to undergoing an MRI fusion biopsy.  Briefly, previously followed by Dr. Gala for PSA screening and had a mildly elevated PSA of 4.8 in 2022, this prompted a prostate MRI showing a 41 g prostate with no suspicious lesions.  PSA increased, and remained elevated around 8 over the last year, prompting a repeat prostate MRI.  I personally viewed and interpreted the most recent prostate MRI from June 2025 showing a 46 g prostate with a 2 cm PI-RADS 4 lesion in the right anterior transition zone, no evidence of metastatic disease.  We reviewed these findings at length.  I strongly agree with MRI fusion biopsy based on new lesion on prostate MRI and doubling of the PSA in the last 30 years.  We reviewed the implications of an elevated PSA and the uncertainty surrounding it. In general, a man's PSA increases with age and is produced by both normal and cancerous prostate tissue. The differential diagnosis for elevated PSA includes BPH, prostate cancer, infection/prostatitis, recent intercourse/ejaculation, recent urethroscopic manipulation (foley placement/cystoscopy) or trauma. Management of an elevated PSA can include observation/surveillance, prostate MRI, or prostate biopsy and we discussed this in detail. Our goal is to detect clinically significant prostate cancers, and manage with either active surveillance, surgery, or radiation for localized disease. Risks of prostate biopsy include bleeding, infection (including life threatening sepsis), pain, and lower urinary symptoms. Hematuria, hematospermia, and blood in the stool are all common after biopsy and can persist up to 4 weeks.  Schedule MRI fusion biopsy, patient prefers nitrous and prefers to wait until  November If nitrous unavailable, will send Valium 10 mg  I spent 30 total minutes on the day of the encounter including pre-visit review of the medical record, face-to-face time with the patient, and post visit ordering of labs/imaging/tests.  Extensive review of prior medical records, MRI x 2, and reviewing risks and benefits of biopsy.   Randy JAYSON Burnet, MD  [facility] [address] [place], [place] [address] [phone]

## 2024-08-05 ENCOUNTER — Telehealth: Payer: Self-pay

## 2024-08-05 NOTE — Telephone Encounter (Signed)
 Called pt, unable to leave VM as it was full. Sent MyChart message and sent text to let pt know TOC has been moved from 11/11 @3pm  to 11/24 @9am .  E2C2, if pt is unable to keep this appt, please reschedule.

## 2024-08-06 ENCOUNTER — Ambulatory Visit (INDEPENDENT_AMBULATORY_CARE_PROVIDER_SITE_OTHER): Admitting: Internal Medicine

## 2024-08-06 ENCOUNTER — Encounter: Payer: Self-pay | Admitting: Internal Medicine

## 2024-08-06 ENCOUNTER — Encounter: Payer: Self-pay | Admitting: Oncology

## 2024-08-06 VITALS — BP 124/84 | HR 62 | Temp 97.7°F | Ht 70.0 in | Wt 219.0 lb

## 2024-08-06 DIAGNOSIS — R972 Elevated prostate specific antigen [PSA]: Secondary | ICD-10-CM

## 2024-08-06 DIAGNOSIS — E559 Vitamin D deficiency, unspecified: Secondary | ICD-10-CM | POA: Diagnosis not present

## 2024-08-06 DIAGNOSIS — E785 Hyperlipidemia, unspecified: Secondary | ICD-10-CM | POA: Diagnosis not present

## 2024-08-06 DIAGNOSIS — I10 Essential (primary) hypertension: Secondary | ICD-10-CM

## 2024-08-06 DIAGNOSIS — Z0000 Encounter for general adult medical examination without abnormal findings: Secondary | ICD-10-CM | POA: Diagnosis not present

## 2024-08-06 DIAGNOSIS — D500 Iron deficiency anemia secondary to blood loss (chronic): Secondary | ICD-10-CM | POA: Diagnosis not present

## 2024-08-06 NOTE — Patient Instructions (Addendum)
  VISIT SUMMARY: Today, we discussed your cholesterol management, anemia, vitamin D  deficiency, prostate evaluation, and the impact of stress on your health. We reviewed your current treatments and made plans for further testing and follow-up appointments.  YOUR PLAN: -PROSTATE LESION: A prostate lesion is an abnormal area in the prostate that needs further investigation. You will proceed with the planned biopsy with Dr. Glennette, and you prefer sedation during the procedure if available. If sedation is not available, consider postponing the biopsy.  -ADULT WELLNESS VISIT: During your routine wellness visit, we discussed the significant stress you are experiencing from caregiving responsibilities. This stress may affect your blood pressure and cholesterol levels. We will schedule a fasting lab appointment to check your cholesterol, vitamin D , and blood counts.  -HYPERLIPIDEMIA: Hyperlipidemia means having high levels of cholesterol in your blood. You have not taken your cholesterol medication for the past 4-5 weeks. It is important to take your medication daily. We will schedule a fasting lab appointment to check your cholesterol levels.  -ESSENTIAL HYPERTENSION: Essential hypertension is high blood pressure with no identifiable cause. We discussed how stress can impact your blood pressure.  -IRON  DEFICIENCY ANEMIA: Iron  deficiency anemia is a condition where you do not have enough healthy red blood cells due to a lack of iron . You will continue follow-up care with Dr. Patt at the cancer center, including regular lab work and infusions as needed.  -VITAMIN D  DEFICIENCY: Vitamin D  deficiency means you have low levels of vitamin D . You have completed a course of vitamin D  supplementation. We will recheck your levels to determine if further supplementation is needed. A fasting lab appointment will be scheduled to check your vitamin D  levels.  INSTRUCTIONS: Please proceed with the planned biopsy with  Dr. Glennette, considering sedation if available. Schedule a fasting lab appointment to check your cholesterol, vitamin D , and blood counts. Continue follow-up care with Dr. Patt at the cancer center for your anemia management.   Please proceed with your prostate biopsy per Dr. Etheleen.                    Contains text generated by [employer].                                 Contains text generated by [employer].

## 2024-08-06 NOTE — Assessment & Plan Note (Signed)
-   This problem is chronic and stable -Patient blood pressure today is 124/82 (at goal) - We will continue with amlodipine  5 mg daily -No further workup at this time

## 2024-08-06 NOTE — Progress Notes (Signed)
 Acute Office Visit  Subjective:     Patient ID: Randy Sawyer, male    DOB: Apr 23, 1961, 63 y.o.   MRN: [id number]  Chief Complaint  Patient presents with   Annual Exam    Discussed the use of AI scribe software for clinical note transcription with the patient, who gave verbal consent to proceed.  History of Present Illness Randy Sawyer is a 63 year old male who presents for follow-up on cholesterol management and anemia.  Hyperlipidemia - Cholesterol levels previously elevated, resulting in an increased medication dose - Cholesterol medication not taken for the past 4-5 weeks - No side effects from cholesterol medication  Anemia and chronic low blood counts - Chronic low blood counts for many years - Ongoing monitoring for anemia - Occasional need for infusions when blood counts are low - Upcoming appointment with a specialist at the cancer center  Vitamin d  deficiency - History of low vitamin D  levels - Completed a course of vitamin D  supplementation - Uncertain if further supplementation is required  Prostate evaluation - Undergoing evaluation for prostate concerns - Planned prostate biopsy next month due to observed coloration changes - Family history of cancer (father) - Regular check-ups every 6-12 months  Psychosocial stress and fatigue - Significant stress related to caregiving for mother with dementia and previous care for father with cancer - Stress has impacted ability to work and manage health - Fatigue attributed to ongoing stress Lab appointment today with warmed up  Review of Systems  Constitutional: Negative.   HENT: Negative.    Respiratory: Negative.    Cardiovascular: Negative.   Genitourinary: Negative.   Musculoskeletal: Negative.   Neurological: Negative.   Psychiatric/Behavioral: Negative.          Objective:    BP 124/84   Pulse 62   Temp 97.7 F (36.5 C)   Ht 5' 10 (1.778 m)   Wt 219 lb (99.3 kg)   SpO2 96%   BMI 31.42  kg/m    Physical Exam Constitutional:      Appearance: Normal appearance.  HENT:     Head: Normocephalic and atraumatic.  Cardiovascular:     Rate and Rhythm: Normal rate and regular rhythm.     Heart sounds: Normal heart sounds.  Pulmonary:     Effort: Pulmonary effort is normal.     Breath sounds: Normal breath sounds. No wheezing, rhonchi or rales.  Abdominal:     General: Bowel sounds are normal. There is no distension.     Palpations: Abdomen is soft.     Tenderness: There is no abdominal tenderness. There is no guarding or rebound.  Musculoskeletal:        General: No swelling or tenderness.     Right lower leg: No edema.     Left lower leg: No edema.  Neurological:     Mental Status: He is alert.  Psychiatric:        Mood and Affect: Mood normal.        Behavior: Behavior normal.     No results found for any visits on 08/06/24.      Assessment & Plan:   Problem List Items Addressed This Visit       Cardiovascular and Mediastinum   Hypertension   - This problem is chronic and stable -Patient blood pressure today is 124/82 (at goal) - We will continue with amlodipine  5 mg daily -No further workup at this time      Relevant Orders  Comprehensive metabolic panel with GFR     Other   IDA (iron  deficiency anemia) (Chronic)   - Patient has a history of iron  deficiency anemia with negative GI workup -He follows with Dr. Babara regularly (last visit in May) -He got iron  supplementation after his last visit -Will recheck CBC today -No further workup at this time      Relevant Orders   CBC with Differential/Platelet   Annual physical exam   - Patient is here for his annual physical exam -He is due for blood work today including CBC, lipid panel, CMP -He completed his hep C screening -He has previously declined HIV screening -Tetanus is up-to-date -Patient is due for pneumonia vaccine.  Will follow-up at next visit      Elevated PSA   - Patient was  noted to have elevated PSA (PSA of approximately 8) and had a prostate MRI done which showed a new 2 cm PI-RADS 4 lesion -Patient is following with urology and scheduled to have a prostate biopsy done with sedation -He would like a repeat PSA on his blood work -We will recheck this today -No further workup at this time      Relevant Orders   PSA   Hyperlipidemia   - This problem is chronic -Patient was noted to have an elevated LDL at his last visit with an increased ASCVD risk of 17% -He states that he has been noncompliant with his Crestor  (forgot to take this) for at least the last 4 to 5 weeks -He denied any issues with side effects from the medication -Will recheck lipid panel -Patient was encouraged to restart his Crestor  -No further workup at this time      Relevant Orders   Lipid panel   Vitamin D  deficiency - Primary   - Patient history of vitamin D  deficiency and status post 12 weeks of high-dose (50,000 units weekly) vitamin D  supplementation -Will recheck vitamin D  level -No further workup at this time      Relevant Orders   VITAMIN D  25 Hydroxy (Vit-D Deficiency, Fractures)    No orders of the defined types were placed in this encounter.   No follow-ups on file.  Kodee Ravert, MD

## 2024-08-06 NOTE — Assessment & Plan Note (Signed)
-   This problem is chronic -Patient was noted to have an elevated LDL at his last visit with an increased ASCVD risk of 17% -He states that he has been noncompliant with his Crestor  (forgot to take this) for at least the last 4 to 5 weeks -He denied any issues with side effects from the medication -Will recheck lipid panel -Patient was encouraged to restart his Crestor  -No further workup at this time

## 2024-08-06 NOTE — Assessment & Plan Note (Signed)
-   Patient history of vitamin D  deficiency and status post 12 weeks of high-dose (50,000 units weekly) vitamin D  supplementation -Will recheck vitamin D  level -No further workup at this time

## 2024-08-06 NOTE — Assessment & Plan Note (Signed)
-   Patient was noted to have elevated PSA (PSA of approximately 8) and had a prostate MRI done which showed a new 2 cm PI-RADS 4 lesion -Patient is following with urology and scheduled to have a prostate biopsy done with sedation -He would like a repeat PSA on his blood work -We will recheck this today -No further workup at this time

## 2024-08-06 NOTE — Assessment & Plan Note (Signed)
-   Patient is here for his annual physical exam -He is due for blood work today including CBC, lipid panel, CMP -He completed his hep C screening -He has previously declined HIV screening -Tetanus is up-to-date -Patient is due for pneumonia vaccine.  Will follow-up at next visit

## 2024-08-06 NOTE — Assessment & Plan Note (Signed)
-   Patient has a history of iron  deficiency anemia with negative GI workup -He follows with Dr. Babara regularly (last visit in May) -He got iron  supplementation after his last visit -Will recheck CBC today -No further workup at this time

## 2024-08-07 ENCOUNTER — Other Ambulatory Visit

## 2024-08-07 ENCOUNTER — Ambulatory Visit: Payer: Self-pay | Admitting: Internal Medicine

## 2024-08-07 DIAGNOSIS — R972 Elevated prostate specific antigen [PSA]: Secondary | ICD-10-CM

## 2024-08-07 DIAGNOSIS — E785 Hyperlipidemia, unspecified: Secondary | ICD-10-CM

## 2024-08-07 DIAGNOSIS — D500 Iron deficiency anemia secondary to blood loss (chronic): Secondary | ICD-10-CM

## 2024-08-07 DIAGNOSIS — I10 Essential (primary) hypertension: Secondary | ICD-10-CM | POA: Diagnosis not present

## 2024-08-07 DIAGNOSIS — E559 Vitamin D deficiency, unspecified: Secondary | ICD-10-CM | POA: Diagnosis not present

## 2024-08-07 LAB — LIPID PANEL
Cholesterol: 160 mg/dL (ref 0–200)
HDL: 38.6 mg/dL — ABNORMAL LOW (ref >39.00)
LDL Cholesterol: 101 mg/dL — ABNORMAL HIGH (ref 0–99)
NonHDL: 121.89
Total CHOL/HDL Ratio: 4
Triglycerides: 104 mg/dL (ref 0.0–149.0)
VLDL: 20.8 mg/dL (ref 0.0–40.0)

## 2024-08-07 LAB — COMPREHENSIVE METABOLIC PANEL WITH GFR
ALT: 32 U/L (ref 0–53)
AST: 30 U/L (ref 0–37)
Albumin: 4.6 g/dL (ref 3.5–5.2)
Alkaline Phosphatase: 68 U/L (ref 39–117)
BUN: 13 mg/dL (ref 6–23)
CO2: 30 meq/L (ref 19–32)
Calcium: 9.5 mg/dL (ref 8.4–10.5)
Chloride: 104 meq/L (ref 96–112)
Creatinine, Ser: 1.15 mg/dL (ref 0.40–1.50)
GFR: 68.14 mL/min (ref >60.00)
Glucose, Bld: 128 mg/dL — ABNORMAL HIGH (ref 70–99)
Potassium: 4.2 meq/L (ref 3.5–5.1)
Sodium: 141 meq/L (ref 135–145)
Total Bilirubin: 0.5 mg/dL (ref 0.2–1.2)
Total Protein: 7.5 g/dL (ref 6.0–8.3)

## 2024-08-07 LAB — CBC WITH DIFFERENTIAL/PLATELET
Basophils Absolute: 0 K/uL (ref 0.0–0.1)
Basophils Relative: 0.4 % (ref 0.0–3.0)
Eosinophils Absolute: 0.1 K/uL (ref 0.0–0.7)
Eosinophils Relative: 1.8 % (ref 0.0–5.0)
HCT: 37.1 % — ABNORMAL LOW (ref 39.0–52.0)
Hemoglobin: 12 g/dL — ABNORMAL LOW (ref 13.0–17.0)
Lymphocytes Relative: 24.5 % (ref 12.0–46.0)
Lymphs Abs: 1.3 K/uL (ref 0.7–4.0)
MCHC: 32.5 g/dL (ref 30.0–36.0)
MCV: 87.9 fl (ref 78.0–100.0)
Monocytes Absolute: 0.4 K/uL (ref 0.1–1.0)
Monocytes Relative: 6.8 % (ref 3.0–12.0)
Neutro Abs: 3.4 K/uL (ref 1.4–7.7)
Neutrophils Relative %: 66.5 % (ref 43.0–77.0)
Platelets: 417 K/uL — ABNORMAL HIGH (ref 150.0–400.0)
RBC: 4.21 Mil/uL — ABNORMAL LOW (ref 4.22–5.81)
RDW: 13.2 % (ref 11.5–15.5)
WBC: 5.2 K/uL (ref 4.0–10.5)

## 2024-08-07 LAB — PSA: PSA: 8.96 ng/mL — ABNORMAL HIGH (ref 0.10–4.00)

## 2024-08-07 LAB — VITAMIN D 25 HYDROXY (VIT D DEFICIENCY, FRACTURES): VITD: 26.78 ng/mL — ABNORMAL LOW (ref 30.00–100.00)

## 2024-08-07 MED ORDER — VITAMIN D (ERGOCALCIFEROL) 1.25 MG (50000 UNIT) PO CAPS: 50000.0000 [IU] | ORAL_CAPSULE | ORAL | 1 refills | Status: AC

## 2024-08-11 NOTE — Telephone Encounter (Signed)
 Copied from CRM [id number]. Topic: Clinical - Lab/Test Results >> Aug 11, 2024 11:03 AM Laymon HERO wrote: Reason for CRM: patient calling back to go over lab results with a nurse

## 2024-08-25 ENCOUNTER — Telehealth: Payer: Self-pay | Admitting: Urology

## 2024-08-25 NOTE — Telephone Encounter (Signed)
 Called pt he questions when and if nitrous will be available for use for his biopsy. Advised pt that unfortunately at this time I do not have an estimate of when the nitrous will be available for use. Advised pt that we could provide an RX for Valium, however he is adamant that Valium will not be enough for him. He states that he will call around to other urologic practices to see if nitrous is available.

## 2024-08-25 NOTE — Telephone Encounter (Signed)
 Patient walked into the office and inquired if nitrous gas will be available for his upcoming biopsy. The patient is requesting a call back for clarification.

## 2024-08-27 ENCOUNTER — Inpatient Hospital Stay: Attending: Oncology

## 2024-08-27 DIAGNOSIS — Z87891 Personal history of nicotine dependence: Secondary | ICD-10-CM | POA: Diagnosis not present

## 2024-08-27 DIAGNOSIS — I10 Essential (primary) hypertension: Secondary | ICD-10-CM | POA: Insufficient documentation

## 2024-08-27 DIAGNOSIS — R011 Cardiac murmur, unspecified: Secondary | ICD-10-CM | POA: Insufficient documentation

## 2024-08-27 DIAGNOSIS — D500 Iron deficiency anemia secondary to blood loss (chronic): Secondary | ICD-10-CM

## 2024-08-27 DIAGNOSIS — K649 Unspecified hemorrhoids: Secondary | ICD-10-CM | POA: Insufficient documentation

## 2024-08-27 DIAGNOSIS — Z79899 Other long term (current) drug therapy: Secondary | ICD-10-CM | POA: Diagnosis not present

## 2024-08-27 DIAGNOSIS — Z8042 Family history of malignant neoplasm of prostate: Secondary | ICD-10-CM | POA: Insufficient documentation

## 2024-08-27 DIAGNOSIS — D509 Iron deficiency anemia, unspecified: Secondary | ICD-10-CM | POA: Diagnosis present

## 2024-08-27 LAB — CBC WITH DIFFERENTIAL (CANCER CENTER ONLY)
Abs Immature Granulocytes: 0.06 K/uL (ref 0.00–0.07)
Basophils Absolute: 0 K/uL (ref 0.0–0.1)
Basophils Relative: 0 %
Eosinophils Absolute: 0.1 K/uL (ref 0.0–0.5)
Eosinophils Relative: 2 %
HCT: 37.6 % — ABNORMAL LOW (ref 39.0–52.0)
Hemoglobin: 11.9 g/dL — ABNORMAL LOW (ref 13.0–17.0)
Immature Granulocytes: 1 %
Lymphocytes Relative: 25 %
Lymphs Abs: 1.6 K/uL (ref 0.7–4.0)
MCH: 28.1 pg (ref 26.0–34.0)
MCHC: 31.6 g/dL (ref 30.0–36.0)
MCV: 88.9 fL (ref 80.0–100.0)
Monocytes Absolute: 0.5 K/uL (ref 0.1–1.0)
Monocytes Relative: 8 %
Neutro Abs: 4 K/uL (ref 1.7–7.7)
Neutrophils Relative %: 64 %
Platelet Count: 418 K/uL — ABNORMAL HIGH (ref 150–400)
RBC: 4.23 MIL/uL (ref 4.22–5.81)
RDW: 12.5 % (ref 11.5–15.5)
WBC Count: 6.2 K/uL (ref 4.0–10.5)
nRBC: 0 % (ref 0.0–0.2)

## 2024-08-27 LAB — FERRITIN: Ferritin: 72 ng/mL (ref 24–336)

## 2024-08-27 LAB — RETIC PANEL
Immature Retic Fract: 13.1 % (ref 2.3–15.9)
RBC.: 4.18 MIL/uL — ABNORMAL LOW (ref 4.22–5.81)
Retic Count, Absolute: 62.3 K/uL (ref 19.0–186.0)
Retic Ct Pct: 1.5 % (ref 0.4–3.1)
Reticulocyte Hemoglobin: 31.7 pg (ref >27.9)

## 2024-08-27 LAB — IRON AND TIBC
Iron: 56 ug/dL (ref 45–182)
Saturation Ratios: 14 % — ABNORMAL LOW (ref 17.9–39.5)
TIBC: 400 ug/dL (ref 250–450)
UIBC: 344 ug/dL

## 2024-09-02 ENCOUNTER — Encounter

## 2024-09-02 ENCOUNTER — Encounter: Payer: Self-pay | Admitting: Urology

## 2024-09-03 ENCOUNTER — Encounter: Payer: Self-pay | Admitting: Oncology

## 2024-09-03 ENCOUNTER — Ambulatory Visit

## 2024-09-03 ENCOUNTER — Inpatient Hospital Stay (HOSPITAL_BASED_OUTPATIENT_CLINIC_OR_DEPARTMENT_OTHER): Admitting: Oncology

## 2024-09-03 VITALS — BP 140/90 | HR 79 | Temp 96.5°F | Resp 18 | Wt 218.5 lb

## 2024-09-03 VITALS — BP 137/87 | HR 64 | Resp 18

## 2024-09-03 DIAGNOSIS — D508 Other iron deficiency anemias: Secondary | ICD-10-CM

## 2024-09-03 DIAGNOSIS — D509 Iron deficiency anemia, unspecified: Secondary | ICD-10-CM | POA: Diagnosis not present

## 2024-09-03 DIAGNOSIS — D500 Iron deficiency anemia secondary to blood loss (chronic): Secondary | ICD-10-CM

## 2024-09-03 DIAGNOSIS — K649 Unspecified hemorrhoids: Secondary | ICD-10-CM

## 2024-09-03 MED ORDER — IRON SUCROSE 20 MG/ML IV SOLN
200.0000 mg | Freq: Once | INTRAVENOUS | Status: AC
  Administered 2024-09-03: 200 mg via INTRAVENOUS
  Filled 2024-09-03: qty 10

## 2024-09-03 NOTE — Assessment & Plan Note (Addendum)
 Intermittently he has hemorrhoid bleeding.  He was seen by GI, and was recommend to hold hold off video capsule endoscopy as long as hemoglobin is stable.

## 2024-09-03 NOTE — Assessment & Plan Note (Addendum)
 Labs are reviewed and discussed with patient. Lab Results  Component Value Date   HGB 11.9 (L) 08/27/2024   TIBC 400 08/27/2024   IRONPCTSAT 14 (L) 08/27/2024   FERRITIN 72 08/27/2024    Recurrent iron  deficiency anemia. He continues to have intermittent blood in stool.  Recommend IV Venofer  x 2 to further improve iron  store.   Check hemoglobin evaluation as his hemoglobin remains mildly decreased chronically.

## 2024-09-03 NOTE — Progress Notes (Signed)
 Hematology/Oncology Progress note [phone] [phone]            Patient Care Team: Babara Call, MD as Consulting Physician (Oncology)  ASSESSMENT & PLAN:   IDA (iron  deficiency anemia) Labs are reviewed and discussed with patient. Lab Results  Component Value Date   HGB 11.9 (L) 08/27/2024   TIBC 400 08/27/2024   IRONPCTSAT 14 (L) 08/27/2024   FERRITIN 72 08/27/2024    Recurrent iron  deficiency anemia. He continues to have intermittent blood in stool.  Recommend IV Venofer  x 2 to further improve iron  store.   Check hemoglobin evaluation as his hemoglobin remains mildly decreased chronically.    Hemorrhoids Intermittently he has hemorrhoid bleeding.  He was seen by GI, and was recommend to hold hold off video capsule endoscopy as long as hemoglobin is stable.   Follow up in 6 months- lab prior to MD + venofer . Iron  labs retic panel.   Orders Placed This Encounter  Procedures   CBC with Differential (Cancer Center Only)    Standing Status:   Future    Expected Date:   12/04/2024    Expiration Date:   03/04/2025   Iron  and TIBC    Standing Status:   Future    Expected Date:   12/04/2024    Expiration Date:   03/04/2025   Ferritin    Standing Status:   Future    Expected Date:   12/04/2024    Expiration Date:   03/04/2025   Hgb Fractionation Cascade    Standing Status:   Future    Expected Date:   12/04/2024    Expiration Date:   03/04/2025   Follow up in 6 months.  All questions were answered. The patient knows to call the clinic with any problems, questions or concerns.  Call Babara, MD, PhD [facility] Hematology Oncology 09/03/2024   CHIEF COMPLAINTS/REASON FOR VISIT:  Follow-up for iron  deficiency anemia  HISTORY OF PRESENTING ILLNESS:   Randy Sawyer is a  63 y.o.  male presents for follow up of anemia  Patient reports that he was previously evaluated by hematologist years ago and was told everything was fine. Patient has a  chronically decreased hemoglobin, dated back to at least 2015.  His baseline is around 11 02/20/2022, hemoglobin has decreased to 9.9, MCV 89.2.  Patient was referred to establish care with hematology for further evaluation. Patient reports chronic history of intermittent blood in the stool.  He reports a colonoscopy in 6 to 7 years, not available in current EMR. Denies any unintentional weight loss, fever, night sweats. He is also over-the-counter supplements including alpha king supreme, -testpsteron booster, yohimbe 451 ultra saw palmetto .  Since his last blood work, he has stopped testosterone . Patient drinks occasionally.   He had a colonoscopy and upper endoscopy performed 09/01/13: Colonoscopy showed small internal hemorrhoids, normal prostate. Exam otherwise normal. The upper endoscopy showed a normal esophagus, non bleeding erosive gastropathy described as two dispersed small non bleeding erosions found in the gastric antrum. There is no stigmata of recent bleeding. There is normal duodenum. Pathology returned chronic gastritis with intestinal metaplasia and atrophy. Negative for Helicobacter pylori, negative for dysplasia.   Repeat colonoscopy requested in 10 years, no repeat EGD requested. Patient was found to have H pylori IGG positive testing before the luminal evaluation and he was treated with Prev pac. Patient had a negative post H pylori treatment stool antigen.    .INTERVAL HISTORY Randy Sawyer is a 63  y.o. male who has above history reviewed by me today presents for follow up visit for management of iron  deficiency anemia Patient reports feeling well. Patient continues to have intermittent hemorrhoid bleeding.  No other new complaints.     Review of Systems  Constitutional:  Negative for appetite change, chills, fatigue, fever and unexpected weight change.  HENT:   Negative for hearing loss and voice change.   Eyes:  Negative for eye problems and icterus.  Respiratory:   Negative for chest tightness, cough and shortness of breath.   Cardiovascular:  Negative for chest pain and leg swelling.  Gastrointestinal:  Positive for blood in stool. Negative for abdominal distention and abdominal pain.  Endocrine: Negative for hot flashes.  Genitourinary:  Negative for difficulty urinating, dysuria and frequency.   Musculoskeletal:  Negative for arthralgias.  Skin:  Negative for itching and rash.  Neurological:  Negative for light-headedness and numbness.  Hematological:  Negative for adenopathy. Does not bruise/bleed easily.  Psychiatric/Behavioral:  Negative for confusion.     MEDICAL HISTORY:  Past Medical History:  Diagnosis Date   Allergy    Heart murmur    Hypertension     SURGICAL HISTORY: Past Surgical History:  Procedure Laterality Date   COLONOSCOPY WITH PROPOFOL  N/A 11/14/2022   Procedure: COLONOSCOPY WITH PROPOFOL ;  Surgeon: Jinny Carmine, MD;  Location: [facility] ENDOSCOPY;  Service: Endoscopy;  Laterality: N/A;   ESOPHAGOGASTRODUODENOSCOPY N/A 11/14/2022   Procedure: ESOPHAGOGASTRODUODENOSCOPY (EGD);  Surgeon: Jinny Carmine, MD;  Location: [facility] ENDOSCOPY;  Service: Endoscopy;  Laterality: N/A;   KNEE SURGERY      SOCIAL HISTORY: Social History   Socioeconomic History   Marital status: Single    Spouse name: Not on file   Number of children: Not on file   Years of education: Not on file   Highest education level: Not on file  Occupational History   Not on file  Tobacco Use   Smoking status: Former    Current packs/day: 0.00    Types: Cigarettes    Quit date: 34    Years since quitting: 27.8   Smokeless tobacco: Never  Vaping Use   Vaping status: Never Used  Substance and Sexual Activity   Alcohol use: Yes    Alcohol/week: 1.0 standard drink of alcohol    Types: 1 Glasses of wine per week    Comment: occationally.   Drug use: Never   Sexual activity: Yes  Other Topics Concern   Not on file  Social History Narrative   Not on file    Social Drivers of Health   Financial Resource Strain: Low Risk  (08/12/2024)   Received from [facility]   Overall Financial Resource Strain (CARDIA)    Difficulty of Paying Living Expenses: Not very hard  Food Insecurity: No Food Insecurity (08/12/2024)   Received from [facility]   Hunger Vital Sign    Within the past 12 months, you worried that your food would run out before you got the money to buy more.: Never true    Within the past 12 months, the food you bought just didn't last and you didn't have money to get more.: Never true  Transportation Needs: No Transportation Needs (08/12/2024)   Received from [facility] - Transportation    In the past 12 months, has lack of transportation kept you from medical appointments or from getting medications?: No    Lack of Transportation (Non-Medical): No  Physical Activity:  Not on file  Stress: Not on file  Social Connections: Not on file  Intimate Partner Violence: Not on file    FAMILY HISTORY: Family History  Problem Relation Age of Onset   Hypertension Mother    Kidney failure Mother    Diabetes Mother    Prostate cancer Father    Diabetes Paternal Grandmother     ALLERGIES:  has no known allergies.  MEDICATIONS:  Current Outpatient Medications  Medication Sig Dispense Refill   amitriptyline (ELAVIL) 10 MG tablet Take by mouth.     amLODipine  (NORVASC ) 5 MG tablet TAKE A HALF TABLET BY MOUTH AT BEDTIME. 45 tablet 1   ascorbic acid (VITAMIN C) 1000 MG tablet Take by mouth.     azelastine  (ASTELIN ) 0.1 % nasal spray Place 2 sprays into both nostrils 2 (two) times daily. Use in each nostril as directed 30 mL 12   ipratropium (ATROVENT ) 0.03 % nasal spray Place 2 sprays into both nostrils every 12 (twelve) hours. 30 mL 12   levocetirizine (XYZAL ) 5 MG tablet Take 1 tablet (5 mg total) by mouth every evening. 90 tablet 1   Multiple Vitamin (MULTIVITAMIN) capsule  Take 1 capsule by mouth daily.     pantoprazole  (PROTONIX ) 40 MG tablet Take 20 mg by mouth daily.     rosuvastatin  (CRESTOR ) 20 MG tablet Take 1 tablet (20 mg total) by mouth daily. 90 tablet 3   triamcinolone (NASACORT) 55 MCG/ACT AERO nasal inhaler Place into the nose.     triamcinolone cream (KENALOG) 0.1 % Apply topically daily.     Vitamin D , Ergocalciferol , (DRISDOL ) 1.25 MG (50000 UNIT) CAPS capsule Take 1 capsule (50,000 Units total) by mouth every 7 (seven) days. 12 capsule 1   No current facility-administered medications for this visit.     PHYSICAL EXAMINATION: Vitals:   09/03/24 1036 09/03/24 1047  BP: (!) 152/91 (!) 140/90  Pulse: 79   Resp: 18   Temp: (!) 96.5 F (35.8 C)   SpO2: 99%    Filed Weights   09/03/24 1036  Weight: 218 lb 8 oz (99.1 kg)    Physical Exam Constitutional:      General: He is not in acute distress.    Appearance: He is obese.  HENT:     Head: Normocephalic and atraumatic.  Eyes:     General: No scleral icterus. Cardiovascular:     Rate and Rhythm: Normal rate and regular rhythm.  Pulmonary:     Effort: Pulmonary effort is normal. No respiratory distress.     Breath sounds: No wheezing.  Abdominal:     General: Bowel sounds are normal. There is no distension.     Palpations: Abdomen is soft.  Musculoskeletal:        General: No deformity. Normal range of motion.     Cervical back: Normal range of motion and neck supple.  Skin:    General: Skin is warm and dry.     Findings: No erythema or rash.  Neurological:     Mental Status: He is alert and oriented to person, place, and time. Mental status is at baseline.  Psychiatric:        Mood and Affect: Mood normal.     LABORATORY DATA:  I have reviewed the data as listed Lab Results  Component Value Date   WBC 6.2 08/27/2024   HGB 11.9 (L) 08/27/2024   HCT 37.6 (L) 08/27/2024   MCV 88.9 08/27/2024   PLT 418 (H) 08/27/2024  Recent Labs    01/30/24 0736 08/07/24 0851   NA 139 141  K 4.5 4.2  CL 101 104  CO2 30 30  GLUCOSE 104* 128*  BUN 15 13  CREATININE 1.25 1.15  CALCIUM  10.1 9.5  PROT 7.8 7.5  ALBUMIN 4.7 4.6  AST 25 30  ALT 32 32  ALKPHOS 69 68  BILITOT 0.6 0.5   Iron /TIBC/Ferritin/ %Sat    Component Value Date/Time   IRON  56 08/27/2024 0921   TIBC 400 08/27/2024 0921   FERRITIN 72 08/27/2024 0921   IRONPCTSAT 14 (L) 08/27/2024 0921      RADIOGRAPHIC STUDIES: I have personally reviewed the radiological images as listed and agreed with the findings in the report. No results found.

## 2024-09-10 ENCOUNTER — Inpatient Hospital Stay

## 2024-09-11 ENCOUNTER — Other Ambulatory Visit: Admitting: Urology

## 2024-09-15 ENCOUNTER — Ambulatory Visit (INDEPENDENT_AMBULATORY_CARE_PROVIDER_SITE_OTHER)

## 2024-09-15 ENCOUNTER — Telehealth: Payer: Self-pay

## 2024-09-15 VITALS — BP 140/84 | HR 67 | Temp 98.3°F | Ht 70.0 in | Wt 217.2 lb

## 2024-09-15 DIAGNOSIS — J301 Allergic rhinitis due to pollen: Secondary | ICD-10-CM | POA: Diagnosis not present

## 2024-09-15 DIAGNOSIS — D75839 Thrombocytosis, unspecified: Secondary | ICD-10-CM

## 2024-09-15 DIAGNOSIS — E785 Hyperlipidemia, unspecified: Secondary | ICD-10-CM

## 2024-09-15 DIAGNOSIS — J309 Allergic rhinitis, unspecified: Secondary | ICD-10-CM

## 2024-09-15 DIAGNOSIS — E559 Vitamin D deficiency, unspecified: Secondary | ICD-10-CM

## 2024-09-15 DIAGNOSIS — R972 Elevated prostate specific antigen [PSA]: Secondary | ICD-10-CM

## 2024-09-15 DIAGNOSIS — I10 Essential (primary) hypertension: Secondary | ICD-10-CM

## 2024-09-15 DIAGNOSIS — R7309 Other abnormal glucose: Secondary | ICD-10-CM

## 2024-09-15 DIAGNOSIS — L989 Disorder of the skin and subcutaneous tissue, unspecified: Secondary | ICD-10-CM | POA: Insufficient documentation

## 2024-09-15 DIAGNOSIS — K219 Gastro-esophageal reflux disease without esophagitis: Secondary | ICD-10-CM

## 2024-09-15 DIAGNOSIS — D509 Iron deficiency anemia, unspecified: Secondary | ICD-10-CM

## 2024-09-15 DIAGNOSIS — E782 Mixed hyperlipidemia: Secondary | ICD-10-CM

## 2024-09-15 DIAGNOSIS — F439 Reaction to severe stress, unspecified: Secondary | ICD-10-CM

## 2024-09-15 MED ORDER — ROSUVASTATIN CALCIUM 20 MG PO TABS: 20.0000 mg | ORAL_TABLET | Freq: Every day | ORAL | 3 refills | Status: AC

## 2024-09-15 MED ORDER — AMLODIPINE BESYLATE 2.5 MG PO TABS: 2.5000 mg | ORAL_TABLET | Freq: Every day | ORAL | 3 refills | Status: DC

## 2024-09-15 NOTE — Assessment & Plan Note (Addendum)
 Chronic. Ordered fasting lipid panel to reassess cholesterol levels. LDL levels remain elevated despite rosuvastatin  20 mg nightly. Goal LDL is less than 100. Encourage increasing moderate intensity exercise 30 min each day for 5 days a week or 150 min total per week.  Orders:   rosuvastatin  (CRESTOR ) 20 MG tablet; Take 1 tablet (20 mg total) by mouth daily.   Lipid panel; Future

## 2024-09-15 NOTE — Telephone Encounter (Signed)
 Copied from CRM #[id number]. Topic: General - Other >> Sep 15, 2024 10:49 AM Laymon HERO wrote: Reason for CRM: Patient asking for Luke to call him. Would not give me any details- said he checked her out when he was leaving visit today.

## 2024-09-15 NOTE — Assessment & Plan Note (Addendum)
 He takes 1/2 tablet of Pantoprazole  daily prn which he is currently holding off on as he is getting ready to get stool H. Pylori through his gastroenterologist at [facility]. He does not need a refill on Protonix  at this time.

## 2024-09-15 NOTE — Assessment & Plan Note (Addendum)
 Check A1c during his next lab visit.  Orders:   HgB A1c; Future

## 2024-09-15 NOTE — Telephone Encounter (Signed)
 I called patient, but his voice mailbox was full.  I sent a message to patient via MyChart.

## 2024-09-15 NOTE — Assessment & Plan Note (Signed)
 Uses Triamcinolone 0.1% cream previously prescribed by his PCP. He has upcoming appointment with dermatologist at [facility] on 09/16/24.  Await dermatology appointment for further evaluation and management. If no new prescription is given, send photo of current cream for potential compounding at pharmacy.

## 2024-09-15 NOTE — Assessment & Plan Note (Addendum)
 Managed with atrovent  nasal spray and azelastine  as needed during pollen season. Xyzal  not used regularly. Continue atrovent  nasal spray daily during allergy season. Use azelastine  as needed. Discontinued Xyzal  prescription.

## 2024-09-15 NOTE — Assessment & Plan Note (Addendum)
 He follows up with heme/onc and gets iron  infusion for this. He also has a h/o hemorrhoids, GERD and follows up with GI. He has upcoming H. Pylori test ordered by his gastroenterologist. I recommend continue follow up with Hematologist.

## 2024-09-15 NOTE — Assessment & Plan Note (Addendum)
 Follow up with urology for potential biopsy/schedule to see urologist Dr. Francisca on 10/09/2024.

## 2024-09-15 NOTE — Assessment & Plan Note (Signed)
 Chronic mild elevation with no symptoms of clotting or bleeding. Continue monitoring platelet levels and encourage patient discuss this with hematologist during follow-up.

## 2024-09-15 NOTE — Assessment & Plan Note (Addendum)
 This is mostly related to the diagnosis of dementia in his mother, occasional job related for which he takes Amitriptyline 10 mg on average twice a year. He does not need refill at this time and will reach out to our clinic if he needs refill.

## 2024-09-15 NOTE — Assessment & Plan Note (Addendum)
 Currently he is taking Vitamin D  50,000 international units once weekly. He has about 3 tablets of prescription strength vitamin D  left.  Plan  to reassess levels before transitioning to OTC supplementation. Will transition to over-the-counter vitamin D  2000 IU if levels are normalized. Orders:   Vitamin D  (25 hydroxy); Future

## 2024-09-15 NOTE — Progress Notes (Signed)
 Established Patient Office Visit TOC from Dr. Hope    Subjective  Patient ID: Randy Sawyer, male    DOB: 10/25/1960  Age: 63 y.o. MRN: [id number]  Chief Complaint  Patient presents with   Establish Care    Discussed the use of AI scribe software for clinical note transcription with the patient, who gave verbal consent to proceed.  History of Present Illness Randy Sawyer is a 63 year old male who presents for transfer of care from previous primary care physician and chronic medication management.   - Iron  deficiency anemia, mild thrombocytosis, GERD: established with heme/onc on Venofer , last visit was 09/03/24. Labs from 08/27/24 showed Hb of 11.9, hct 37.6, Iron  56, Ferritin 72. His hemoglobin levels have been stable, though he tends towards lower hemoglobin and slightly elevated platelet counts. He has a history of occasional hemorrhoidal bleeding, which he associates with constipation. He is undergoing testing for H. pylori as a potential contributing factor to the anemia. His gastroenterologist is [employer] at [place] [facility]. He takes Pantoprazole  20 mg (1/2 of 40 mg) which is holding off taking in preparation for H. Pylori screening.   - Elevated PSA/Last PSA 8.96 on 08/07/24. He has an appointment with urologist Dr. Francisca on 10/09/2024. Plan is for him to go prostate biopsy.  - Vitamin D  deficiency, lab 08/07/24 showed 26.78. He takes prescription-strength vitamin D  once weekly, with three tablets remaining. Prefers repeating vitamin D  level once he is done with prescription of vitamin D .   - Hypertension: He manages hypertension with amlodipine  2.5 mg every other day. His blood pressure often elevates during medical visits but normalizes afterward. He attributes recent fluctuations to stress from caring for his mother, who has dementia. No palpitations, chest pain, lower leg edema. He participates in cardiovascular exercise 3 days a week, eats a balanced diet.   - Seasonal  allergic allergies, particularly in the spring, with symptoms exacerbated by pollen and ragweed. He uses Atrovent  nasal spray daily from late February to mid-June and azelastine  as needed. He does not take Xyzal .    - Hyperlipidemia: LDL 101 on 08/07/24.  On Rosuvastatin  20 mg.   - Facial skin lesions: treated with  Kenalog 0.1% cream. He has upcoming appointment with dermatologist this week.  - He is a former smoker, having quit in 1998, and drinks alcohol very occasionally, typically a glass of wine with dinner once or twice a year. 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 He takes prn Amitriptyline 10 mg on average twice a year.      ROS As per HPI    Objective:     BP (!) 140/84 (BP Location: Right Arm, Patient Position: Sitting, Cuff Size: Normal)   Sawyer 67   Temp 98.3 F (36.8 C) (Oral)   Ht 5' 10 (1.778 m)   Wt 217 lb 3.2 oz (98.5 kg)   SpO2 98%   BMI 31.16 kg/m      09/15/2024   10:02 AM 09/03/2024   11:40 AM 08/06/2024    3:05 PM  Depression screen PHQ 2/9  Decreased Interest 0 0 0  Down, Depressed, Hopeless 0 0 0  PHQ - 2 Score 0 0 0  Altered sleeping 0  1  Tired, decreased energy 0  1  Change in appetite 0  0  Feeling bad or failure about yourself  0  0  Trouble concentrating 1  0  Moving  slowly or fidgety/restless 0  0  Suicidal thoughts 0  0  PHQ-9 Score 1  2   Difficult doing work/chores Not difficult at all  Not difficult at all     Data saved with a previous flowsheet row definition      09/15/2024   10:02 AM 08/06/2024    3:06 PM 08/06/2023   11:32 AM 07/25/2023    9:10 AM  GAD 7 : Generalized Anxiety Score  Nervous, Anxious, on Edge 0 1 0 0  Control/stop worrying 0 0 0 0  Worry too much - different things 0 1 0 0  Trouble relaxing 1 1 0 0  Restless  0 0 0  Easily annoyed or irritable 0 0 0 1  Afraid - awful might happen 0 0 0 0  Total GAD 7 Score  3 0 1  Anxiety  Difficulty Not difficult at all Not difficult at all        09/15/2024   10:02 AM 09/03/2024   11:40 AM 08/06/2024    3:05 PM  Depression screen PHQ 2/9  Decreased Interest 0 0 0  Down, Depressed, Hopeless 0 0 0  PHQ - 2 Score 0 0 0  Altered sleeping 0  1  Tired, decreased energy 0  1  Change in appetite 0  0  Feeling bad or failure about yourself  0  0  Trouble concentrating 1  0  Moving slowly or fidgety/restless 0  0  Suicidal thoughts 0  0  PHQ-9 Score 1  2   Difficult doing work/chores Not difficult at all  Not difficult at all     Data saved with a previous flowsheet row definition      09/15/2024   10:02 AM 08/06/2024    3:06 PM 08/06/2023   11:32 AM 07/25/2023    9:10 AM  GAD 7 : Generalized Anxiety Score  Nervous, Anxious, on Edge 0 1 0 0  Control/stop worrying 0 0 0 0  Worry too much - different things 0 1 0 0  Trouble relaxing 1 1 0 0  Restless  0 0 0  Easily annoyed or irritable 0 0 0 1  Afraid - awful might happen 0 0 0 0  Total GAD 7 Score  3 0 1  Anxiety Difficulty Not difficult at all Not difficult at all     SDOH Screenings   Food Insecurity: No Food Insecurity (08/12/2024)   Received from [facility]  Housing: Low Risk  (08/12/2024)   Received from [facility]  Transportation Needs: No Transportation Needs (08/12/2024)   Received from [facility]  Utilities: Not At Risk (08/12/2024)   Received from [facility]  Depression [id number]): Low Risk  (09/15/2024)  Financial Resource Strain: Low Risk  (08/12/2024)   Received from [facility]  Tobacco Use: Medium Risk (09/15/2024)     Physical Exam Constitutional:      General: He is in acute distress.     Appearance: Normal appearance.  HENT:     Head: Normocephalic and atraumatic.     Right Ear: Tympanic membrane and external ear normal. There is no impacted cerumen.     Left Ear: Tympanic membrane and  external ear normal. There is no impacted cerumen.     Mouth/Throat:     Mouth: Mucous membranes are moist.  Neck:     Thyroid: No thyroid mass or thyroid tenderness.  Cardiovascular:  Rate and Rhythm: Normal rate and regular rhythm.  Pulmonary:     Effort: Pulmonary effort is normal.     Breath sounds: Normal breath sounds. No wheezing.  Abdominal:     General: Bowel sounds are normal.     Palpations: Abdomen is soft.     Tenderness: There is no abdominal tenderness. There is no guarding or rebound.  Musculoskeletal:     Cervical back: Neck supple. No rigidity.     Right lower leg: No edema.     Left lower leg: No edema.  Skin:    General: Skin is warm.  Neurological:     Mental Status: He is alert and oriented to person, place, and time.  Psychiatric:        Mood and Affect: Mood normal.        Behavior: Behavior normal.        No results found for any visits on 09/15/24.  The 10-year ASCVD risk score (Arnett DK, et al., 2019) is: 17.1%     Assessment & Plan:   Patient is a pleasant 63 year old male presenting for establishing care. He is due for COVID-19, pneumonia, and RSV vaccinations. HIV screening recommended but declined. Recommend updating immunization through local pharmacy.  Assessment & Plan Primary hypertension Goal BP <130/80 mmHg, BP not well controlled on Amlodipine  2.5 mg every other day.  Increased amlodipine  to 2.5 mg daily at nighttime. New BP prescription sent to the pharmacy.  Instructed to monitor blood pressure at home regularly. Encouraged increased physical activity and DASH diet. Scheduled follow-up in 2-3 months with home blood pressure readings. Orders:   amLODipine  (NORVASC ) 2.5 MG tablet; Take 1 tablet (2.5 mg total) by mouth daily.  Seasonal allergic rhinitis due to pollen Managed with atrovent  nasal spray and azelastine  as needed during pollen season. Xyzal  not used regularly. Continue atrovent  nasal spray daily during allergy  season. Use azelastine  as needed. Discontinued Xyzal  prescription.    Mixed hyperlipidemia Chronic. Ordered fasting lipid panel to reassess cholesterol levels. LDL levels remain elevated despite rosuvastatin  20 mg nightly. Goal LDL is less than 100. Encourage increasing moderate intensity exercise 30 min each day for 5 days a week or 150 min total per week.  Orders:   rosuvastatin  (CRESTOR ) 20 MG tablet; Take 1 tablet (20 mg total) by mouth daily.   Lipid panel; Future  Vitamin D  deficiency Currently he is taking Vitamin D  50,000 international units once weekly. He has about 3 tablets of prescription strength vitamin D  left.  Plan  to reassess levels before transitioning to OTC supplementation. Will transition to over-the-counter vitamin D  2000 IU if levels are normalized. Orders:   Vitamin D  (25 hydroxy); Future  Abnormal glucose Check A1c during his next lab visit.  Orders:   HgB A1c; Future  Stress This is mostly related to the diagnosis of dementia in his mother, occasional job related for which he takes Amitriptyline 10 mg on average twice a year. He does not need refill at this time and will reach out to our clinic if he needs refill.     Iron  deficiency anemia, unspecified iron  deficiency anemia type He follows up with heme/onc and gets iron  infusion for this. He also has a h/o hemorrhoids, GERD and follows up with GI. He has upcoming H. Pylori test ordered by his gastroenterologist. I recommend continue follow up with Hematologist.     Gastroesophageal reflux disease, unspecified whether esophagitis present He takes 1/2 tablet of Pantoprazole  daily prn which  he is currently holding off on as he is getting ready to get stool H. Pylori through his gastroenterologist at [facility]. He does not need a refill on Protonix  at this time.     Elevated PSA Follow up with urology for potential biopsy/schedule to see urologist Dr. Francisca on 10/09/2024.    Skin lesion of face Uses  Triamcinolone 0.1% cream previously prescribed by his PCP. He has upcoming appointment with dermatologist at [facility] on 09/16/24.  Await dermatology appointment for further evaluation and management. If no new prescription is given, send photo of current cream for potential compounding at pharmacy.    Thrombocytosis Chronic mild elevation with no symptoms of clotting or bleeding. Continue monitoring platelet levels and encourage patient discuss this with hematologist during follow-up.     Return in about 3 months (around 12/16/2024) for BP follow up in 2-3 months.   Luke Shade, MD

## 2024-09-15 NOTE — Telephone Encounter (Signed)
 Noted

## 2024-09-15 NOTE — Assessment & Plan Note (Addendum)
 Goal BP <130/80 mmHg, BP not well controlled on Amlodipine  2.5 mg every other day.  Increased amlodipine  to 2.5 mg daily at nighttime. New BP prescription sent to the pharmacy.  Instructed to monitor blood pressure at home regularly. Encouraged increased physical activity and DASH diet. Scheduled follow-up in 2-3 months with home blood pressure readings. Orders:   amLODipine  (NORVASC ) 2.5 MG tablet; Take 1 tablet (2.5 mg total) by mouth daily.

## 2024-09-15 NOTE — Patient Instructions (Addendum)
-   Check BP 3 times a week, please keep a track of BP reading and follow up in about 2-3 months for blood pressure follow up. You can use Omron 3 BP cuff for BP check.  - Take Amlodipine  2.5 mg once a daily at night time.  - You can update RSV, pneumonia and most recent COVID-19 vaccine through local pharmacy.

## 2024-09-29 ENCOUNTER — Inpatient Hospital Stay: Attending: Oncology

## 2024-09-29 VITALS — BP 161/88 | HR 58 | Temp 96.5°F | Resp 18

## 2024-09-29 DIAGNOSIS — D509 Iron deficiency anemia, unspecified: Secondary | ICD-10-CM | POA: Insufficient documentation

## 2024-09-29 DIAGNOSIS — D508 Other iron deficiency anemias: Secondary | ICD-10-CM

## 2024-09-29 MED ORDER — IRON SUCROSE 20 MG/ML IV SOLN
200.0000 mg | Freq: Once | INTRAVENOUS | Status: AC
  Administered 2024-09-29: 200 mg via INTRAVENOUS
  Filled 2024-09-29: qty 10

## 2024-09-29 NOTE — Patient Instructions (Signed)

## 2024-10-06 ENCOUNTER — Other Ambulatory Visit

## 2024-10-08 ENCOUNTER — Telehealth: Payer: Self-pay | Admitting: Urology

## 2024-10-08 ENCOUNTER — Other Ambulatory Visit: Payer: Self-pay | Admitting: Urology

## 2024-10-08 MED ORDER — DIAZEPAM 10 MG PO TABS: 10.0000 mg | ORAL_TABLET | Freq: Once | ORAL | 0 refills | Status: DC | PRN

## 2024-10-08 NOTE — Telephone Encounter (Signed)
 Patient dropped in office to get instructions for Fusion Biopsy. He also is requesting Valium  prescription be sent to [facility] in [place].

## 2024-10-09 ENCOUNTER — Ambulatory Visit (INDEPENDENT_AMBULATORY_CARE_PROVIDER_SITE_OTHER): Admitting: Urology

## 2024-10-09 ENCOUNTER — Other Ambulatory Visit: Admitting: Urology

## 2024-10-09 VITALS — BP 152/95 | HR 65

## 2024-10-09 DIAGNOSIS — C61 Malignant neoplasm of prostate: Secondary | ICD-10-CM | POA: Diagnosis not present

## 2024-10-09 DIAGNOSIS — Z2989 Encounter for other specified prophylactic measures: Secondary | ICD-10-CM

## 2024-10-09 DIAGNOSIS — R972 Elevated prostate specific antigen [PSA]: Secondary | ICD-10-CM

## 2024-10-09 MED ORDER — LEVOFLOXACIN 500 MG PO TABS
500.0000 mg | ORAL_TABLET | Freq: Once | ORAL | Status: AC
  Administered 2024-10-09: 15:00:00 500 mg via ORAL

## 2024-10-09 MED ORDER — GENTAMICIN SULFATE 40 MG/ML IJ SOLN
80.0000 mg | Freq: Once | INTRAMUSCULAR | Status: AC
  Administered 2024-10-09: 15:00:00 80 mg via INTRAMUSCULAR

## 2024-10-09 NOTE — Patient Instructions (Signed)

## 2024-10-09 NOTE — Progress Notes (Signed)
° °  10/09/2024  Indication: Elevated PSA, abnormal prostate MRI  MRI Fusion Prostate Biopsy Procedure   Informed consent was obtained, and we discussed the risks of bleeding and infection/sepsis. A time out was performed to ensure correct patient identity.  Pre-Procedure: - Last PSA Level: 9 - Gentamicin  and levaquin  given for antibiotic prophylaxis -Prostate measured 45 g on MRI, PSA density 0.2 - No significant hypoechoic or median lobe noted  Procedure: - Prostate block performed using 10 cc 1% lidocaine   - MRI fusion biopsy was performed, and 5 biopsies were taken from the ROI PIRADS 4 lesion located right transition zone - Total of 5 cores taken  Post-Procedure: - Patient tolerated the procedure well - He was counseled to seek immediate medical attention if experiences significant bleeding, fevers, or severe pain - Return in one week to discuss biopsy results  Assessment/ Plan: Will follow up in 1-2 weeks to discuss pathology(if intermediate high risk disease will change follow-up to Dr. Georganne to offer radical prostatectomy)  Randy Burnet, MD 10/09/2024

## 2024-10-14 LAB — PROSTATE CORE NEEDLE BIOPSY

## 2024-10-28 ENCOUNTER — Encounter: Payer: Self-pay | Admitting: Oncology

## 2024-10-28 ENCOUNTER — Ambulatory Visit: Admitting: Urology

## 2024-10-28 VITALS — BP 169/97 | HR 72

## 2024-10-28 DIAGNOSIS — R972 Elevated prostate specific antigen [PSA]: Secondary | ICD-10-CM | POA: Diagnosis not present

## 2024-10-28 DIAGNOSIS — C61 Malignant neoplasm of prostate: Secondary | ICD-10-CM | POA: Diagnosis not present

## 2024-10-28 NOTE — Progress Notes (Signed)
" ° °  10/28/2024 8:47 AM   Randy Sawyer 11-Jan-1961 [id number]  Reason for visit: Review prostate biopsy results, new diagnosis prostate cancer  History: History of benign MRI in 2022 with Dr. Gala for PSA of 5 PSA increased to 9 and confirmed on recheck, prostate MRI June 2025 showed new PI-RADS 4 lesion right anterior transition zone, 46 g prostate Underwent MRI fusion biopsy of ROI showing 10% max core involvement of grade group 1 low risk prostate cancer  Physical Exam: BP (!) 169/97 (BP Location: Left Arm, Patient Position: Sitting, Cuff Size: Large) Comment: No BP meds yet  Pulse 72   SpO2 96%   Imaging/labs: MRI fusion biopsy 10/09/2024: 10% max core involvement of ROI with Gleason score 3+3=6 grade group 1 prostate adenocarcinoma  Plan:   Low risk prostate cancer: We had a lengthy conversation today about the patient's new diagnosis of prostate cancer.  We reviewed the risk classifications per the AUA guidelines including very low risk, low risk, intermediate risk, and high risk disease, and the need for additional staging imaging with CT and bone scan in patients with unfavorable intermediate risk and high risk disease.  I explained that his life expectancy, clinical stage, Gleason score, PSA, and other co-morbidities influence treatment strategies.  We discussed the roles of active surveillance, radiation therapy, surgical therapy with robotic prostatectomy, and hormone therapy with androgen deprivation.  We discussed that patients urinary symptoms also impact treatment strategy, as patients with severe lower urinary tract symptoms may have significant worsening or even develop urinary retention after undergoing radiation.  In regards to surgery, we discussed robotic prostatectomy +/- lymphadenectomy at length.  The procedure takes 3 to 4 hours, and patient's typically discharge home on post-op day #1.  A Foley catheter is left in place for 7 to 10 days to allow for healing of the  vesicourethral anastomosis.  There is a small risk of bleeding, infection, damage to surrounding structures or bowel, hernia, DVT/PE, or serious cardiac or pulmonary complications.  We discussed at length post-op side effects including erectile dysfunction, and the importance of pre-operative erectile function on long-term outcomes.  Even with a nerve sparing approach, there is an approximately 25% rate of permanent erectile dysfunction.  We also discussed postop urinary incontinence at length.  We expect patients to have stress incontinence post-operatively that will improve over period of weeks to months.  Less than 10% of men will require a pad at 1 year after surgery.  Patients will need to avoid heavy lifting and strenuous activity for 3 to 4 weeks, but most men return to their baseline activity status by 6 weeks.  He opts for active surveillance per the guideline recommendations, will order GPS score for further restratification, RTC 6 months PSA prior, consider repeat biopsy and/or MRI within the next 2 years  I spent 25 total minutes on the day of the encounter including pre-visit review of the medical record, face-to-face time with the patient, and post visit ordering of labs/imaging/tests.   Randy JAYSON Burnet, MD  [facility] [address] [place], [place] [address] [phone]  "

## 2024-10-28 NOTE — Patient Instructions (Addendum)
 You had a very small amount of slow-growing prostate cancer on your biopsy(grade group 1, Gleason score 6), this can typically be monitored safely with active surveillance which includes monitoring the PSA 2-3 times yearly, considering a repeat biopsy in the next 2 years or a repeat MRI.  If your PSA were to increase significantly or MRI change we may need to consider other treatments like surgery or radiation.  Prostate Cancer  The prostate is a small gland that produces fluid that makes up semen (seminal fluid). It is located below the bladder in men, in front of the rectum. Prostate cancer is the abnormal growth of cells in the prostate gland. What are the causes? The exact cause of this condition is not known. What increases the risk? You are more likely to develop this condition if: You are 47 years of age or older. You have a family history of prostate cancer. You have a family history of breast and ovarian cancer. You have genes that are passed from parent to child (inherited), such as BRCA1 and BRCA2. You have Lynch syndrome. African American men and men of African descent are diagnosed with prostate cancer at higher rates than other men. The reasons for this are not well understood and are likely due to a combination of genetic and environmental factors. What are the signs or symptoms? Symptoms of this condition include: Problems with urination. This may include: A weak or interrupted flow of urine. Trouble starting or stopping urination. Trouble emptying the bladder all the way. The need to urinate more often, especially at night. Blood in urine or semen. Persistent pain or discomfort in the lower back, lower abdomen, or hips. Trouble getting an erection. Weakness or numbness in the legs or feet. How is this diagnosed? This condition can be diagnosed with: A digital rectal exam. For this exam, a health care provider inserts a gloved finger into the rectum to feel the prostate  gland. A blood test called a prostate-specific antigen (PSA) test. A procedure in which a sample of tissue is taken from the prostate and checked under a microscope (prostate biopsy). An imaging test called transrectal ultrasonography. Once the condition is diagnosed, tests will be done to determine how far the cancer has spread. This is called staging the cancer. Staging may involve imaging tests, such as a bone scan, CT scan, PET scan, or MRI. Stages of prostate cancer The stages of prostate cancer are as follows: Stage 1 (I). At this stage, the cancer is found in the prostate only. The cancer is not visible on imaging tests, and it is usually found by accident, such as during prostate surgery. Stage 2 (II). At this stage, the cancer is more advanced than it is in stage 1, but the cancer has not spread outside the prostate. Stage 3 (III). At this stage, the cancer has spread beyond the outer layer of the prostate to nearby tissues. The cancer may be found in the seminal vesicles, which are near the bladder and the prostate. Stage 4 (IV). At this stage, the cancer has spread to other parts of the body, such as the lymph nodes, bones, bladder, rectum, liver, or lungs. Prostate cancer grading Prostate cancer is also graded according to how the cancer cells look under a microscope. This is called the Gleason score and the total score can range from 6-10, indicating how likely it is that the cancer will spread (metastasize) to other parts of the body. The higher the score, the greater the likelihood  that the cancer will spread. Gleason 6 or lower: This indicates that the cancer cells look similar to normal prostate cells (well differentiated). Gleason 7: This indicates that the cancer cells look somewhat similar to normal prostate cells (moderately differentiated). Gleason 8, 9, or 10: This indicates that the cancer cells look very different than normal prostate cells (poorly differentiated). How is  this treated? Treatment for this condition depends on several factors, including the stage of the cancer, your age, personal preferences, and your overall health. Talk with your health care provider about treatment options that are recommended for you. Common treatments include: Observation for early stage prostate cancer (active surveillance). This involves having exams, blood tests, and in some cases, more biopsies. For some men, this is the only treatment needed. Surgery. Types of surgeries include: Open surgery (radical prostatectomy). In this surgery, a larger incision is made to remove the prostate. A laparoscopic radical prostatectomy. This is a surgery to remove the prostate and lymph nodes through several small incisions. It is often referred to as a minimally invasive surgery. A robotic radical prostatectomy. This is laparoscopic surgery to remove the prostate and lymph nodes with the help of robotic arms that are controlled by the surgeon. Cryoablation. This is surgery to freeze and destroy cancer cells. Radiation treatment. Types of radiation treatment include: External beam radiation. This type aims beams of radiation from outside the body at the prostate to destroy cancerous cells. Brachytherapy. This type uses radioactive needles, seeds, wires, or tubes that are implanted into the prostate gland. Like external beam radiation, brachytherapy destroys cancerous cells. An advantage is that this type of radiation limits the damage to surrounding tissue and has fewer side effects. Chemotherapy. This treatment kills cancer cells or stops them from multiplying. It kills both cancer cells and normal cells. Targeted therapy. This treatment uses medicines to kill cancer cells without damaging normal cells. Hormone treatment. This treatment involves taking medicines that act on testosterone , one of the male hormones, by: Stopping your body from producing testosterone . Blocking testosterone  from  reaching cancer cells. Follow these instructions at home: Lifestyle Do not use any products that contain nicotine or tobacco. These products include cigarettes, chewing tobacco, and vaping devices, such as e-cigarettes. If you need help quitting, ask your health care provider. Eat a healthy diet. To do this: Eat foods that are high in fiber. These include beans, whole grains, and fresh fruits and vegetables. Limit foods that are high in fat and sugar. These include fried or sweet foods. Treatment for prostate cancer may affect sexual function. If you have a partner, continue to have intimate moments. This may include touching, holding, hugging, and caressing your partner. Get plenty of sleep. Consider joining a support group for men who have prostate cancer. Meeting with a support group may help you learn to manage the stress of having cancer. General instructions Take over-the-counter and prescription medicines only as told by your health care provider. If you have to go to the hospital, notify your cancer specialist (oncologist). Keep all follow-up visits. This is important. Where to find more information American Cancer Society: www.cancer.org American Society of Clinical Oncology: www.cancer.net [employer]: www.cancer.gov Contact a health care provider if: You have new or increasing trouble urinating. You have new or increasing blood in your urine. You have new or increasing pain in your hips, back, or chest. Get help right away if: You have weakness or numbness in your legs. You cannot control urination or your bowel movements (incontinence).  You have chills or a fever. Summary The prostate is a small gland that is involved in the production of semen. It is located below a man's bladder, in front of the rectum. Prostate cancer is the abnormal growth of cells in the prostate gland. Treatment for this condition depends on the stage of the cancer, your age, personal  preferences, and your overall health. Talk with your health care provider about treatment options that are recommended for you. Consider joining a support group for men who have prostate cancer. Meeting with a support group may help you learn to manage the stress of having cancer. This information is not intended to replace advice given to you by your health care provider. Make sure you discuss any questions you have with your health care provider. Document Revised: 01/05/2021 Document Reviewed: 01/05/2021 Elsevier Patient Education  2024 [employer].

## 2024-11-19 ENCOUNTER — Encounter: Payer: Self-pay | Admitting: Oncology

## 2024-11-26 ENCOUNTER — Inpatient Hospital Stay: Attending: Oncology

## 2024-11-26 DIAGNOSIS — D500 Iron deficiency anemia secondary to blood loss (chronic): Secondary | ICD-10-CM

## 2024-11-26 LAB — IRON AND TIBC
Iron: 51 ug/dL (ref 45–182)
Saturation Ratios: 14 % — ABNORMAL LOW (ref 17.9–39.5)
TIBC: 363 ug/dL (ref 250–450)
UIBC: 311 ug/dL

## 2024-11-26 LAB — CBC WITH DIFFERENTIAL (CANCER CENTER ONLY)
Abs Immature Granulocytes: 0.01 10*3/uL (ref 0.00–0.07)
Basophils Absolute: 0 10*3/uL (ref 0.0–0.1)
Basophils Relative: 0 %
Eosinophils Absolute: 0.1 10*3/uL (ref 0.0–0.5)
Eosinophils Relative: 2 %
HCT: 39.3 % (ref 39.0–52.0)
Hemoglobin: 12.4 g/dL — ABNORMAL LOW (ref 13.0–17.0)
Immature Granulocytes: 0 %
Lymphocytes Relative: 24 %
Lymphs Abs: 1.2 10*3/uL (ref 0.7–4.0)
MCH: 28.6 pg (ref 26.0–34.0)
MCHC: 31.6 g/dL (ref 30.0–36.0)
MCV: 90.8 fL (ref 80.0–100.0)
Monocytes Absolute: 0.4 10*3/uL (ref 0.1–1.0)
Monocytes Relative: 8 %
Neutro Abs: 3.3 10*3/uL (ref 1.7–7.7)
Neutrophils Relative %: 66 %
Platelet Count: 392 10*3/uL (ref 150–400)
RBC: 4.33 MIL/uL (ref 4.22–5.81)
RDW: 12.4 % (ref 11.5–15.5)
WBC Count: 5.1 10*3/uL (ref 4.0–10.5)
nRBC: 0 % (ref 0.0–0.2)

## 2024-11-26 LAB — FERRITIN: Ferritin: 233 ng/mL (ref 24–336)

## 2024-11-27 ENCOUNTER — Ambulatory Visit

## 2024-11-27 ENCOUNTER — Encounter: Payer: Self-pay | Admitting: Oncology

## 2024-11-27 VITALS — BP 140/80 | HR 77 | Temp 98.1°F | Ht 70.0 in | Wt 219.2 lb

## 2024-11-27 DIAGNOSIS — E782 Mixed hyperlipidemia: Secondary | ICD-10-CM

## 2024-11-27 DIAGNOSIS — E559 Vitamin D deficiency, unspecified: Secondary | ICD-10-CM

## 2024-11-27 DIAGNOSIS — F439 Reaction to severe stress, unspecified: Secondary | ICD-10-CM

## 2024-11-27 DIAGNOSIS — I10 Essential (primary) hypertension: Secondary | ICD-10-CM

## 2024-11-27 DIAGNOSIS — M62838 Other muscle spasm: Secondary | ICD-10-CM | POA: Insufficient documentation

## 2024-11-27 MED ORDER — ESCITALOPRAM OXALATE 10 MG PO TABS: 10.0000 mg | ORAL_TABLET | Freq: Every day | ORAL | 3 refills | Status: AC

## 2024-11-27 MED ORDER — AMLODIPINE BESYLATE 5 MG PO TABS: 5.0000 mg | ORAL_TABLET | Freq: Every day | ORAL | 3 refills | Status: AC

## 2024-11-27 NOTE — Assessment & Plan Note (Signed)
 Significant stress due to caregiving responsibilities. Stress likely contributing to elevated blood pressure, muscle spasm.  Current use of amitriptyline not ideal due to side effects. Discussed escitalopram  10 mg daily as a safer alternative. - Discontinued amitriptyline. - Prescribed escitalopram  10 mg for daily use to manage stress and anxiety. - Offered referral to a therapist for additional support.  Patient will let us  know if he changes his mind about seeing a counselor. Orders:   escitalopram  (LEXAPRO ) 10 MG tablet; Take 1 tablet (10 mg total) by mouth daily.

## 2024-11-27 NOTE — Patient Instructions (Addendum)
 Please schedule fasting labs at 2 weeks. 8 hours fasting, water is okay. Repeat blood glucose/A1c, cholesterol/lipid panel,  vitamin D , urine microalbumin.   Consider starting Escitalopram  10 mg daily to help with stress, anxiety. If you want to talk to someone please let me know and I can refer you to a therapist.    Goal BP is <130/80 mmHg. Increase amlodipine  5 mg daily to help with blood pressure. Check BP 2-3 times a week to keep a record.    Consider x-ray of left shoulder if pain persists.

## 2024-11-27 NOTE — Progress Notes (Signed)
 "  Established Patient Office Visit   Subjective  Patient ID: Randy Sawyer, male    DOB: 07-Dec-1960  Age: 64 y.o. MRN: [id number]  Chief Complaint  Patient presents with   Hypertension   Arm Pain    Discussed the use of AI scribe software for clinical note transcription with the patient, who gave verbal consent to proceed.  History of Present Illness Randy Sawyer is a 64 year old male with hypertension who presents with elevated blood pressure and stress-related symptoms.   He has been experiencing elevated blood pressure, which he attributes to increased stress and lack of sleep. He has not been consistent with his blood pressure medication, amlodipine  2.5 mg due to caregiving responsibilities for his mother, who has dementia. He usually takes his medication every morning but missed it recently due to being preoccupied with his mother's care.  He is on amitriptyline 10 mg which he takes intermittently to help manage stress.  He describes significant stress related to caregiving for his mother, who has been sick for three years with progressing dementia. This has impacted his ability to work consistently and manage his own health. He notes that he has not been eating or sleeping well, which he believes contributes to his health issues. He also mentions caregiver fatigue, having previously cared for his father who had cancer for fifteen years.  He experiences left arm and shoulder pain.  Reports related with ache on left upper shoulder, muscle tightness These symptoms have been present for about two weeks. He is right-hand dominant and does not recall any specific trigger for the pain.  He has a history of low vitamin D  levels.  On weekly vitamin D  50,000 units daily.  He has a history of prostate cancer and follows up with urology.  He has a history of anemia and sees oncology for this.      ROS As per HPI    Objective:     BP (!) 140/80   Pulse 77   Temp 98.1 F (36.7 C)  (Oral)   Ht 5' 10 (1.778 m)   Wt 219 lb 3.2 oz (99.4 kg)   SpO2 98%   BMI 31.45 kg/m      11/27/2024    3:29 PM 09/29/2024    8:16 AM 09/15/2024   10:02 AM  Depression screen PHQ 2/9  Decreased Interest 1 0 0  Down, Depressed, Hopeless 0 0 0  PHQ - 2 Score 1 0 0  Altered sleeping 1  0  Tired, decreased energy 1  0  Change in appetite 0  0  Feeling bad or failure about yourself  0  0  Trouble concentrating 1  1  Moving slowly or fidgety/restless 0  0  Suicidal thoughts 0  0  PHQ-9 Score 4  1  Difficult doing work/chores Not difficult at all  Not difficult at all      11/27/2024    3:29 PM 09/15/2024   10:02 AM 08/06/2024    3:06 PM 08/06/2023   11:32 AM  GAD 7 : Generalized Anxiety Score  Nervous, Anxious, on Edge 1 0  1  0   Control/stop worrying 0 0  0  0   Worry too much - different things 1 0  1  0   Trouble relaxing 0 1  1  0   Restless 0  0  0   Easily annoyed or irritable 1 0  0  0   Afraid - awful  might happen 0 0  0  0   Total GAD 7 Score 3  3 0  Anxiety Difficulty  Not difficult at all Not difficult at all      Data saved with a previous flowsheet row definition      11/27/2024    3:29 PM 09/29/2024    8:16 AM 09/15/2024   10:02 AM  Depression screen PHQ 2/9  Decreased Interest 1 0 0  Down, Depressed, Hopeless 0 0 0  PHQ - 2 Score 1 0 0  Altered sleeping 1  0  Tired, decreased energy 1  0  Change in appetite 0  0  Feeling bad or failure about yourself  0  0  Trouble concentrating 1  1  Moving slowly or fidgety/restless 0  0  Suicidal thoughts 0  0  PHQ-9 Score 4  1  Difficult doing work/chores Not difficult at all  Not difficult at all      11/27/2024    3:29 PM 09/15/2024   10:02 AM 08/06/2024    3:06 PM 08/06/2023   11:32 AM  GAD 7 : Generalized Anxiety Score  Nervous, Anxious, on Edge 1 0  1  0   Control/stop worrying 0 0  0  0   Worry too much - different things 1 0  1  0   Trouble relaxing 0 1  1  0   Restless 0  0  0   Easily annoyed  or irritable 1 0  0  0   Afraid - awful might happen 0 0  0  0   Total GAD 7 Score 3  3 0  Anxiety Difficulty  Not difficult at all Not difficult at all      Data saved with a previous flowsheet row definition   SDOH Screenings   Food Insecurity: No Food Insecurity (08/12/2024)   Received from [facility]  Housing: Low Risk  (08/12/2024)   Received from [facility]  Transportation Needs: No Transportation Needs (08/12/2024)   Received from [facility]  Utilities: Not At Risk (08/12/2024)   Received from [facility]  Depression [phone]): Low Risk (11/27/2024)  Financial Resource Strain: Low Risk  (08/12/2024)   Received from [facility]  Tobacco Use: Medium Risk (11/27/2024)     Physical Exam Constitutional:      General: He is not in acute distress.    Appearance: Normal appearance.  HENT:     Head: Normocephalic and atraumatic.     Mouth/Throat:     Mouth: Mucous membranes are moist.  Neck:     Thyroid: No thyroid mass or thyroid tenderness.  Cardiovascular:     Rate and Rhythm: Normal rate and regular rhythm.  Pulmonary:     Effort: Pulmonary effort is normal.     Breath sounds: Normal breath sounds. No wheezing.  Abdominal:     General: Bowel sounds are normal.     Palpations: Abdomen is soft.     Tenderness: There is no abdominal tenderness.  Musculoskeletal:     Right shoulder: No tenderness, bony tenderness or crepitus. Normal range of motion. Normal strength.     Left shoulder: No tenderness, bony tenderness or crepitus. Normal range of motion. Normal strength.     Cervical back: Neck supple. Spasms (Left paracervical tightness noted compared to right.) present. No signs of trauma, rigidity or tenderness. No pain with movement.     Right lower leg:  No edema.     Left lower leg: No edema.  Skin:    General: Skin is warm.  Neurological:     Mental Status: He is alert and  oriented to person, place, and time.     Motor: No weakness.  Psychiatric:        Mood and Affect: Mood normal.        Behavior: Behavior normal.        No results found for any visits on 11/27/24.  The 10-year ASCVD risk score (Arnett DK, et al., 2019) is: 17.1%     Assessment & Plan:  Patient is a pleasant 64 year old male presenting for hypertension follow-up.   Assessment & Plan Primary hypertension Blood pressure elevated at 140/90 mmHg, likely due to stress, poor sleep, and inconsistent medication adherence. Current medication amlodipine  2.5 mg may be insufficient. Discussed risks of uncontrolled hypertension. - Increased amlodipine  to 5 mg daily. - Instructed to monitor blood pressure at home 2-3 times a week and keep a record. - Scheduled follow-up in 8 weeks to reassess blood pressure management.  Check urine microalbumin with next labs as well. Orders:   Urine Microalbumin w/creat. ratio; Future   amLODipine  (NORVASC ) 5 MG tablet; Take 1 tablet (5 mg total) by mouth daily.  Stress Significant stress due to caregiving responsibilities. Stress likely contributing to elevated blood pressure, muscle spasm.  Current use of amitriptyline not ideal due to side effects. Discussed escitalopram  10 mg daily as a safer alternative. - Discontinued amitriptyline. - Prescribed escitalopram  10 mg for daily use to manage stress and anxiety. - Offered referral to a therapist for additional support.  Patient will let us  know if he changes his mind about seeing a counselor. Orders:   escitalopram  (LEXAPRO ) 10 MG tablet; Take 1 tablet (10 mg total) by mouth daily.  Cervical paraspinal muscle spasm Cervical paraspinal muscle tightness on palpation.  Bilateral shoulder exam normal on exam.  Patient prefers symptomatic management including stress management, working on exercise, improving sleep quality. Will consider x-ray if symptoms do not improve.    Vitamin D  deficiency Recommend  continuing to take vitamin D  50,000 units once weekly.  Recommend updating vitamin D  level in 2 to 3 weeks.  Patient will schedule a lab appointment to schedule this.    Mixed hyperlipidemia Reviewed lipid panel from 08/07/2024 which showed improvement in LDL cholesterol.  Continue rosuvastatin  20 mg daily.  Recommend updating fasting lipid panel.  Lab has already been ordered.     Return in about 8 weeks (around 01/22/2025) for for BP follow up with Dr. Abbey, lab at patient's convinience. .   Davaughn Hillyard, MD "

## 2024-11-27 NOTE — Assessment & Plan Note (Signed)
 Recommend continuing to take vitamin D  50,000 units once weekly.  Recommend updating vitamin D  level in 2 to 3 weeks.  Patient will schedule a lab appointment to schedule this.

## 2024-11-27 NOTE — Assessment & Plan Note (Addendum)
 Blood pressure elevated at 140/90 mmHg, likely due to stress, poor sleep, and inconsistent medication adherence. Current medication amlodipine  2.5 mg may be insufficient. Discussed risks of uncontrolled hypertension. - Increased amlodipine  to 5 mg daily. - Instructed to monitor blood pressure at home 2-3 times a week and keep a record. - Scheduled follow-up in 8 weeks to reassess blood pressure management.  Check urine microalbumin with next labs as well. Orders:   Urine Microalbumin w/creat. ratio; Future   amLODipine  (NORVASC ) 5 MG tablet; Take 1 tablet (5 mg total) by mouth daily.

## 2024-11-27 NOTE — Assessment & Plan Note (Signed)
 Reviewed lipid panel from 08/07/2024 which showed improvement in LDL cholesterol.  Continue rosuvastatin  20 mg daily.  Recommend updating fasting lipid panel.  Lab has already been ordered.

## 2024-11-27 NOTE — Assessment & Plan Note (Signed)
 Cervical paraspinal muscle tightness on palpation.  Bilateral shoulder exam normal on exam.  Patient prefers symptomatic management including stress management, working on exercise, improving sleep quality. Will consider x-ray if symptoms do not improve.

## 2024-11-28 LAB — HGB FRACTIONATION CASCADE
Hgb A2: 2.6 % (ref 1.8–3.2)
Hgb A: 97.4 % (ref 96.4–98.8)
Hgb F: 0 % (ref 0.0–2.0)
Hgb S: 0 %

## 2024-12-03 ENCOUNTER — Inpatient Hospital Stay

## 2024-12-03 ENCOUNTER — Inpatient Hospital Stay: Admitting: Oncology

## 2024-12-11 ENCOUNTER — Other Ambulatory Visit

## 2025-01-22 ENCOUNTER — Ambulatory Visit

## 2025-02-18 ENCOUNTER — Other Ambulatory Visit

## 2025-02-25 ENCOUNTER — Ambulatory Visit: Admitting: Urology
